# Patient Record
Sex: Female | Born: 1985 | Race: White | Hispanic: No | Marital: Married | Smoking: Current every day smoker
Health system: Southern US, Community
[De-identification: ages and names within clinical notes are randomized; demographics above are authoritative.]

## PROBLEM LIST (undated history)

## (undated) ENCOUNTER — Inpatient Hospital Stay (HOSPITAL_COMMUNITY): Payer: Self-pay

## (undated) DIAGNOSIS — F419 Anxiety disorder, unspecified: Secondary | ICD-10-CM

## (undated) DIAGNOSIS — I10 Essential (primary) hypertension: Secondary | ICD-10-CM

## (undated) DIAGNOSIS — Z6791 Unspecified blood type, Rh negative: Secondary | ICD-10-CM

## (undated) DIAGNOSIS — F329 Major depressive disorder, single episode, unspecified: Secondary | ICD-10-CM

## (undated) DIAGNOSIS — D509 Iron deficiency anemia, unspecified: Secondary | ICD-10-CM

## (undated) DIAGNOSIS — Z8742 Personal history of other diseases of the female genital tract: Secondary | ICD-10-CM

## (undated) DIAGNOSIS — F32A Depression, unspecified: Secondary | ICD-10-CM

---
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
## 2007-01-25 ENCOUNTER — Inpatient Hospital Stay (HOSPITAL_COMMUNITY): Admission: AD | Admit: 2007-01-25 | Discharge: 2007-01-29 | Payer: Self-pay | Admitting: Obstetrics

## 2007-01-26 ENCOUNTER — Encounter (INDEPENDENT_AMBULATORY_CARE_PROVIDER_SITE_OTHER): Payer: Self-pay | Admitting: Obstetrics

## 2007-07-21 ENCOUNTER — Ambulatory Visit: Payer: Self-pay | Admitting: Family Medicine

## 2007-07-21 DIAGNOSIS — R5381 Other malaise: Secondary | ICD-10-CM

## 2007-07-21 DIAGNOSIS — F17200 Nicotine dependence, unspecified, uncomplicated: Secondary | ICD-10-CM

## 2007-07-21 DIAGNOSIS — R5383 Other fatigue: Secondary | ICD-10-CM

## 2007-07-22 LAB — CONVERTED CEMR LAB
Albumin: 4 g/dL (ref 3.5–5.2)
Alkaline Phosphatase: 63 units/L (ref 39–117)
BUN: 8 mg/dL (ref 6–23)
Bilirubin, Direct: 0.1 mg/dL (ref 0.0–0.3)
CO2: 26 meq/L (ref 19–32)
GFR calc Af Amer: 137 mL/min
Monocytes Absolute: 0.5 10*3/uL (ref 0.1–1.0)
Monocytes Relative: 9.8 % (ref 3.0–12.0)
Neutrophils Relative %: 53.6 % (ref 43.0–77.0)
Platelets: 299 10*3/uL (ref 150–400)
TSH: 1.66 microintl units/mL (ref 0.35–5.50)
Total Bilirubin: 0.7 mg/dL (ref 0.3–1.2)
Total Protein: 6.8 g/dL (ref 6.0–8.3)
WBC: 5.3 10*3/uL (ref 4.5–10.5)

## 2007-08-04 ENCOUNTER — Ambulatory Visit: Payer: Self-pay | Admitting: Family Medicine

## 2007-08-04 DIAGNOSIS — D509 Iron deficiency anemia, unspecified: Secondary | ICD-10-CM

## 2007-08-04 DIAGNOSIS — F329 Major depressive disorder, single episode, unspecified: Secondary | ICD-10-CM | POA: Insufficient documentation

## 2007-09-15 ENCOUNTER — Ambulatory Visit: Payer: Self-pay | Admitting: Family Medicine

## 2007-09-16 LAB — CONVERTED CEMR LAB
Basophils Absolute: 0 10*3/uL (ref 0.0–0.1)
Monocytes Relative: 9.9 % (ref 3.0–12.0)
Neutro Abs: 2.7 10*3/uL (ref 1.4–7.7)
Neutrophils Relative %: 57.5 % (ref 43.0–77.0)
Platelets: 204 10*3/uL (ref 150–400)
RBC: 4.4 M/uL (ref 3.87–5.11)
WBC: 4.7 10*3/uL (ref 4.5–10.5)

## 2007-11-19 ENCOUNTER — Ambulatory Visit: Payer: Self-pay | Admitting: Family Medicine

## 2007-12-31 ENCOUNTER — Inpatient Hospital Stay (HOSPITAL_COMMUNITY): Admission: AD | Admit: 2007-12-31 | Discharge: 2007-12-31 | Payer: Self-pay | Admitting: Obstetrics & Gynecology

## 2008-01-06 ENCOUNTER — Observation Stay (HOSPITAL_COMMUNITY): Admission: AD | Admit: 2008-01-06 | Discharge: 2008-01-07 | Payer: Self-pay | Admitting: Obstetrics & Gynecology

## 2008-03-25 ENCOUNTER — Emergency Department (HOSPITAL_COMMUNITY): Admission: EM | Admit: 2008-03-25 | Discharge: 2008-03-25 | Payer: Self-pay | Admitting: Emergency Medicine

## 2009-03-30 ENCOUNTER — Inpatient Hospital Stay (HOSPITAL_COMMUNITY): Admission: AD | Admit: 2009-03-30 | Discharge: 2009-03-30 | Payer: Self-pay | Admitting: Family Medicine

## 2009-09-10 ENCOUNTER — Ambulatory Visit: Payer: Self-pay | Admitting: Family Medicine

## 2009-09-10 LAB — CONVERTED CEMR LAB
AST: 20 units/L (ref 0–37)
Albumin: 3.9 g/dL (ref 3.5–5.2)
Basophils Relative: 0.6 % (ref 0.0–3.0)
Creatinine, Ser: 0.6 mg/dL (ref 0.4–1.2)
Eosinophils Absolute: 0.1 10*3/uL (ref 0.0–0.7)
GFR calc non Af Amer: 127.67 mL/min (ref >60)
Glucose, Bld: 68 mg/dL — ABNORMAL LOW (ref 70–99)
HCT: 30.8 % — ABNORMAL LOW (ref 36.0–46.0)
Hemoglobin: 10 g/dL — ABNORMAL LOW (ref 12.0–15.0)
MCHC: 32.6 g/dL (ref 30.0–36.0)
MCV: 69.6 fL — ABNORMAL LOW (ref 78.0–100.0)
Neutrophils Relative %: 53.6 % (ref 43.0–77.0)
Platelets: 198 10*3/uL (ref 150.0–400.0)
RBC: 4.43 M/uL (ref 3.87–5.11)
RDW: 19.8 % — ABNORMAL HIGH (ref 11.5–14.6)
Total Bilirubin: 0.3 mg/dL (ref 0.3–1.2)
Total Protein: 6.7 g/dL (ref 6.0–8.3)
WBC: 4.7 10*3/uL (ref 4.5–10.5)

## 2009-09-11 ENCOUNTER — Encounter: Payer: Self-pay | Admitting: Family Medicine

## 2009-11-12 ENCOUNTER — Ambulatory Visit: Payer: Self-pay | Admitting: Family Medicine
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
## 2010-03-26 NOTE — Letter (Signed)
Summary: Generic Letter  [place] [facility]  [address] [place], [place] [address]   Phone: [phone]  Fax: [phone]    09/11/2009    [facility] [address] [place], [place]  [address]    Dear Ms. Needham,  I have tried to reach you at all your contact numbers [phone] [phone] with out success. (all numbers have been disconnected). I am trying to reach you in regards to your lab results. Please call Dr Royden Purl office and ask to speak with Rena. Dr Milinda Antis has further instruction and other medication you need to pick up and take.   Please contact us at [phone] as soon as possible.           Sincerely,   Lewanda Rife LPN  Appended Document: Generic Letter Did not have to send this letter. I located a work # for pt and reached her at work.

## 2010-05-15 LAB — CBC
Hemoglobin: 9.6 g/dL — ABNORMAL LOW (ref 12.0–15.0)
MCHC: 33.2 g/dL (ref 30.0–36.0)
Platelets: 222 10*3/uL (ref 150–400)
RDW: 18.1 % — ABNORMAL HIGH (ref 11.5–15.5)
WBC: 5.5 10*3/uL (ref 4.0–10.5)

## 2010-05-15 LAB — URINALYSIS, ROUTINE W REFLEX MICROSCOPIC
Hgb urine dipstick: NEGATIVE
Ketones, ur: NEGATIVE mg/dL
Urobilinogen, UA: 0.2 mg/dL (ref 0.0–1.0)

## 2010-05-15 LAB — ABO/RH: ABO/RH(D): A NEG

## 2010-05-15 LAB — WET PREP, GENITAL
Clue Cells Wet Prep HPF POC: NONE SEEN
Trich, Wet Prep: NONE SEEN
Yeast Wet Prep HPF POC: NONE SEEN

## 2010-05-15 LAB — GC/CHLAMYDIA PROBE AMP, GENITAL
Chlamydia, DNA Probe: NEGATIVE
GC Probe Amp, Genital: NEGATIVE

## 2010-05-15 LAB — HCG, QUANTITATIVE, PREGNANCY: hCG, Beta Chain, Quant, S: 56704 m[IU]/mL — ABNORMAL HIGH (ref <5)

## 2010-05-15 LAB — POCT PREGNANCY, URINE: Preg Test, Ur: POSITIVE

## 2010-06-10 LAB — POCT URINALYSIS DIP (DEVICE)
Bilirubin Urine: NEGATIVE
Glucose, UA: NEGATIVE mg/dL
Hgb urine dipstick: NEGATIVE
Urobilinogen, UA: 0.2 mg/dL (ref 0.0–1.0)

## 2010-06-10 LAB — URINE CULTURE

## 2010-06-10 LAB — POCT PREGNANCY, URINE: Preg Test, Ur: POSITIVE

## 2010-07-09 NOTE — Consult Note (Signed)
Jeanne Lyons, Jeanne Lyons               ACCOUNT NO.:  [id number]   MEDICAL RECORD NO.:  [id number]          PATIENT TYPE:  MAT   LOCATION:  [facility]                          FACILITY:  [facility]   PHYSICIAN:  Lenoard Aden, M.D.DATE OF BIRTH:  [birth year]   DATE OF CONSULTATION:  DATE OF DISCHARGE:                                 CONSULTATION   CHIEF COMPLAINT:  She is a 25 year old white female, G2, P0, history of  previous pregnancy loss with unsure LMP and quant greater than 100,000  for evaluation of spotting.   ALLERGIES:  SHE HAS NO KNOWN DRUG ALLERGIES.   MEDICATIONS:  Prenatal vitamins and Prometrium.   HABITS:  She is a non-smoker, non-drinker.  She denies domestic or  physical violence.   FAMILY HISTORY:  Noncontributory.   SOCIAL HISTORY:  Noncontributory.   PHYSICAL EXAMINATION:  GENERAL:  She is a well-developed, well-nourished  white female, no acute distress.  HEENT:  Lungs are clear.  HEART:  Regular rhythm.  ABDOMEN:  Soft, nontender.  Uterus is 8-10 weeks size.  No adnexal  masses.  Cervix is closed.  No active bleeding noted.  EXTREMITIES:  No crepitus.  NEUROLOGIC:  Nonfocal.   Ultrasound reveals a 10-week intrauterine pregnancy with small  subchorionic hematoma, fetal heart tones noted.  Blood type is A  negative.   IMPRESSION:  1. A 10-week intrauterine pregnancy with first trimester bleeding,      viability noted.  Small subchorionic hemorrhage noted.  2. Rh negative.   PLAN:  Proceed with RhoGAM.  Discharge home.  Precautions given.  Follow  up for obstetric care within 1 to 2 weeks.      Lenoard Aden, M.D.  Electronically Signed     RJT/MEDQ  D:  08/01/2006  T:  08/01/2006  Job:  [id number]   cc:   Lenoard Aden, M.D.  Fax: [phone]

## 2010-07-12 NOTE — Discharge Summary (Signed)
Jeanne Lyons, Jeanne Lyons               ACCOUNT NO.:  [id number]   MEDICAL RECORD NO.:  [id number]          PATIENT TYPE:  INP   LOCATION:  9320                          FACILITY:  [facility]   PHYSICIAN:  Kathreen Cosier, M.D.DATE OF BIRTH:  October 02, 1985   DATE OF ADMISSION:  01/25/2007  DATE OF DISCHARGE:  01/29/2007                               DISCHARGE SUMMARY   The patient is a 25 year old gravida 3, para 0-0-2-0, [facility] February 25, 2007, seen in the office on December 1 with a blood pressure of 140/100,  and in the hospital diastolics up to 104.  Platelets 132,000, 2+  reflexes, uric acid 6+.  GBS unknown.  Cleocin 900 IV q.8 hours was  given.  She was admitted at 36 weeks, magnesium sulfate and induction.  She was started on Pitocin.  On December 2, had a normal vaginal  delivery of a female, Apgar's 6/7.  Team in attendance.   POSTDELIVERY:  On postpartum day #1, highest blood pressure 127/84.  Output good.  She was continued on magnesium.  Her hemoglobin was 7.9  and she was started on ferrous sulfate twice a day.  On December 4,  postpartum day #2, highest blood pressure 157/108 and the lowest was  160/67, and the magnesium was discontinued.  She was started on  labetalol p.o. b.i.d.  She did well.  On December 5, blood pressure  130/85.  She was discharged home on labetalol 200 mg p.o. b.i.d.,  ferrous sulfate twice a day and Tylox for pain.   DISCHARGE DIAGNOSIS:  Status post preeclampsia at 36 weeks, normal  vaginal delivery.           ______________________________  Kathreen Cosier, M.D.     BAM/MEDQ  D:  02/16/2007  T:  02/17/2007  Job:  [id number]

## 2010-08-15 ENCOUNTER — Emergency Department (INDEPENDENT_AMBULATORY_CARE_PROVIDER_SITE_OTHER): Payer: Self-pay

## 2010-08-15 ENCOUNTER — Emergency Department (HOSPITAL_BASED_OUTPATIENT_CLINIC_OR_DEPARTMENT_OTHER)
Admission: EM | Admit: 2010-08-15 | Discharge: 2010-08-15 | Disposition: A | Discharge status: Home / Self Care | Payer: Self-pay | Attending: Emergency Medicine | Admitting: Emergency Medicine

## 2010-08-15 DIAGNOSIS — W19XXXA Unspecified fall, initial encounter: Secondary | ICD-10-CM | POA: Insufficient documentation

## 2010-08-15 DIAGNOSIS — IMO0002 Reserved for concepts with insufficient information to code with codable children: Secondary | ICD-10-CM | POA: Insufficient documentation

## 2010-08-15 DIAGNOSIS — Y92009 Unspecified place in unspecified non-institutional (private) residence as the place of occurrence of the external cause: Secondary | ICD-10-CM | POA: Insufficient documentation

## 2010-08-15 DIAGNOSIS — M25519 Pain in unspecified shoulder: Secondary | ICD-10-CM

## 2010-08-30 ENCOUNTER — Inpatient Hospital Stay (HOSPITAL_COMMUNITY)
Admission: AD | Admit: 2010-08-30 | Discharge: 2010-08-30 | Disposition: A | Discharge status: Home / Self Care | Payer: Self-pay | Source: Ambulatory Visit | Attending: Obstetrics and Gynecology | Admitting: Obstetrics and Gynecology

## 2010-08-30 DIAGNOSIS — R109 Unspecified abdominal pain: Secondary | ICD-10-CM | POA: Insufficient documentation

## 2010-08-30 DIAGNOSIS — N760 Acute vaginitis: Secondary | ICD-10-CM | POA: Insufficient documentation

## 2010-08-30 DIAGNOSIS — N949 Unspecified condition associated with female genital organs and menstrual cycle: Secondary | ICD-10-CM

## 2010-08-30 DIAGNOSIS — N938 Other specified abnormal uterine and vaginal bleeding: Secondary | ICD-10-CM | POA: Insufficient documentation

## 2010-08-30 DIAGNOSIS — A499 Bacterial infection, unspecified: Secondary | ICD-10-CM

## 2010-08-30 DIAGNOSIS — B9689 Other specified bacterial agents as the cause of diseases classified elsewhere: Secondary | ICD-10-CM | POA: Insufficient documentation

## 2010-08-30 LAB — URINALYSIS, ROUTINE W REFLEX MICROSCOPIC
Glucose, UA: NEGATIVE mg/dL
Ketones, ur: NEGATIVE mg/dL
Nitrite: NEGATIVE
Specific Gravity, Urine: 1.01 (ref 1.005–1.030)
pH: 6 (ref 5.0–8.0)

## 2010-08-30 LAB — POCT PREGNANCY, URINE: Preg Test, Ur: NEGATIVE

## 2010-08-30 LAB — WET PREP, GENITAL: Yeast Wet Prep HPF POC: NONE SEEN

## 2010-08-30 LAB — URINE MICROSCOPIC-ADD ON

## 2010-08-31 LAB — GC/CHLAMYDIA PROBE AMP, GENITAL
Chlamydia, DNA Probe: NEGATIVE
GC Probe Amp, Genital: NEGATIVE

## 2010-11-26 LAB — RH IMMUNE GLOBULIN WORKUP (NOT WOMEN'S HOSP)
ABO/RH(D): A NEG
Antibody Screen: NEGATIVE

## 2010-11-26 LAB — CBC
HCT: 24.1 — ABNORMAL LOW
Hemoglobin: 8.2 — ABNORMAL LOW
MCHC: 34
MCHC: 34.1
MCV: 84.7
Platelets: 146 — ABNORMAL LOW
Platelets: 159
Platelets: 171
RDW: 15.2
RDW: 15.4
RDW: 15.6 — ABNORMAL HIGH
WBC: 6.4

## 2010-11-26 LAB — DIFFERENTIAL
Basophils Absolute: 0
Lymphocytes Relative: 30
Lymphs Abs: 1.9
Neutrophils Relative %: 62

## 2010-11-26 LAB — URINE MICROSCOPIC-ADD ON

## 2010-11-26 LAB — URINALYSIS, ROUTINE W REFLEX MICROSCOPIC
Glucose, UA: NEGATIVE
Glucose, UA: NEGATIVE
Ketones, ur: 15 — AB
Leukocytes, UA: NEGATIVE
Nitrite: NEGATIVE
Protein, ur: NEGATIVE
Specific Gravity, Urine: <1.005 — ABNORMAL LOW
Urobilinogen, UA: 0.2
pH: 5.5

## 2010-11-26 LAB — POCT PREGNANCY, URINE: Preg Test, Ur: POSITIVE

## 2010-11-26 LAB — TYPE AND SCREEN
ABO/RH(D): A NEG
Antibody Screen: NEGATIVE

## 2010-12-02 LAB — URINALYSIS, ROUTINE W REFLEX MICROSCOPIC
Bilirubin Urine: NEGATIVE
Glucose, UA: NEGATIVE
Nitrite: NEGATIVE
Specific Gravity, Urine: <1.005 — ABNORMAL LOW
pH: 6

## 2010-12-02 LAB — COMPREHENSIVE METABOLIC PANEL
ALT: 12
ALT: 13
AST: 25
Albumin: 1.6 — ABNORMAL LOW
Albumin: 2.2 — ABNORMAL LOW
Albumin: 2.6 — ABNORMAL LOW
Alkaline Phosphatase: 122 — ABNORMAL HIGH
Alkaline Phosphatase: 85
BUN: 1 — ABNORMAL LOW
CO2: 22
CO2: 23
Chloride: 104
Chloride: 107
Creatinine, Ser: 0.66
GFR calc Af Amer: >60
Glucose, Bld: 88
Potassium: 3.2 — ABNORMAL LOW
Potassium: 3.3 — ABNORMAL LOW
Potassium: 3.3 — ABNORMAL LOW
Sodium: 137
Sodium: 138
Total Bilirubin: 0.6
Total Bilirubin: 0.7
Total Protein: 5.2 — ABNORMAL LOW

## 2010-12-02 LAB — CBC
HCT: 23.7 — ABNORMAL LOW
Hemoglobin: 10.2 — ABNORMAL LOW
MCV: 84.3
MCV: 85.7
Platelets: 138 — ABNORMAL LOW
Platelets: 139 — ABNORMAL LOW
Platelets: 141 — ABNORMAL LOW
RBC: 2.76 — ABNORMAL LOW
RBC: 3.7 — ABNORMAL LOW
RDW: 14.8
WBC: 10.4
WBC: 8
WBC: 9.5

## 2010-12-02 LAB — URINE MICROSCOPIC-ADD ON

## 2010-12-02 LAB — RPR: RPR Ser Ql: NONREACTIVE

## 2010-12-02 LAB — URIC ACID: Uric Acid, Serum: 6.4

## 2010-12-02 LAB — RH IMMUNE GLOB WKUP(>/=20WKS)(NOT WOMEN'S HOSP)

## 2010-12-02 LAB — MAGNESIUM: Magnesium: 6.1

## 2010-12-04 LAB — RH IMMUNE GLOBULIN WORKUP (NOT WOMEN'S HOSP)

## 2010-12-05 LAB — URINALYSIS, ROUTINE W REFLEX MICROSCOPIC
Glucose, UA: NEGATIVE
Hgb urine dipstick: NEGATIVE
Ketones, ur: NEGATIVE
Protein, ur: NEGATIVE
Urobilinogen, UA: 0.2

## 2010-12-06 LAB — POCT URINALYSIS DIP (DEVICE)
Ketones, ur: NEGATIVE
Protein, ur: NEGATIVE
Specific Gravity, Urine: 1.01
Urobilinogen, UA: 0.2
pH: 6.5

## 2010-12-06 LAB — GC/CHLAMYDIA PROBE AMP, GENITAL
Chlamydia, DNA Probe: NEGATIVE
GC Probe Amp, Genital: NEGATIVE

## 2010-12-06 LAB — URINE CULTURE: Colony Count: 2000

## 2010-12-12 LAB — URINALYSIS, ROUTINE W REFLEX MICROSCOPIC
Bilirubin Urine: NEGATIVE
Glucose, UA: NEGATIVE
Hgb urine dipstick: NEGATIVE
Ketones, ur: NEGATIVE
Nitrite: NEGATIVE
Protein, ur: NEGATIVE
Specific Gravity, Urine: <1.005 — ABNORMAL LOW
Urobilinogen, UA: 0.2
pH: 7

## 2010-12-12 LAB — RH IMMUNE GLOBULIN WORKUP (NOT WOMEN'S HOSP): ABO/RH(D): A NEG

## 2010-12-12 LAB — ABO/RH
ABO/RH(D): A NEG
DAT, IgG: NEGATIVE

## 2010-12-12 LAB — CBC
MCV: 89
Platelets: 221
WBC: 5.9

## 2010-12-12 LAB — HCG, QUANTITATIVE, PREGNANCY: hCG, Beta Chain, Quant, S: 117529 — ABNORMAL HIGH

## 2010-12-12 LAB — POCT PREGNANCY, URINE
Operator id: 246641
Preg Test, Ur: POSITIVE

## 2011-08-07 ENCOUNTER — Encounter (HOSPITAL_COMMUNITY): Payer: Self-pay | Admitting: *Deleted

## 2011-08-07 ENCOUNTER — Inpatient Hospital Stay (HOSPITAL_COMMUNITY)
Admission: AD | Admit: 2011-08-07 | Discharge: 2011-08-07 | Disposition: A | Discharge status: Home / Self Care | Payer: Medicaid Other | Source: Ambulatory Visit | Attending: Obstetrics & Gynecology | Admitting: Obstetrics & Gynecology

## 2011-08-07 DIAGNOSIS — R109 Unspecified abdominal pain: Secondary | ICD-10-CM | POA: Insufficient documentation

## 2011-08-07 DIAGNOSIS — Z3201 Encounter for pregnancy test, result positive: Secondary | ICD-10-CM | POA: Insufficient documentation

## 2011-08-07 LAB — URINALYSIS, ROUTINE W REFLEX MICROSCOPIC
Hgb urine dipstick: NEGATIVE
Nitrite: NEGATIVE
Specific Gravity, Urine: 1.01 (ref 1.005–1.030)
Urobilinogen, UA: 0.2 mg/dL (ref 0.0–1.0)
pH: 5.5 (ref 5.0–8.0)

## 2011-08-07 NOTE — MAU Provider Note (Signed)
Jeanne Kluck Plumley26 y.o.[id number] @[redacted]w[redacted]d  by LMP Chief Complaint  Patient presents with  . Abdominal Cramping     None     SUBJECTIVE  HPI: Pt presents to MAU reporting she had mild abdominal cramping before arriving in MAU but denies pain now.  She had a negative HPT last week but her test at home today was positive.   Patient's last menstrual period was 06/24/2011.  She reports irregular periods.  She denies LOF, vaginal bleeding, vaginal itching/burning, urinary symptoms, h/a, dizziness, n/v, or fever/chills.    Past Medical History  Diagnosis Date  . No pertinent past medical history    Past Surgical History  Procedure Date  . No past surgeries    History   Social History  . Marital Status: Single    Spouse Name: N/A    Number of Children: N/A  . Years of Education: N/A   Occupational History  . Not on file.   Social History Main Topics  . Smoking status: Current Some Day Smoker -- 1.0 packs/day  . Smokeless tobacco: Not on file  . Alcohol Use: No  . Drug Use: No  . Sexually Active: Yes   Other Topics Concern  . Not on file   Social History Narrative  . No narrative on file   No current facility-administered medications on file prior to encounter.   No current outpatient prescriptions on file prior to encounter.   Allergies  Allergen Reactions  . Amoxicillin     REACTION: rash  . Drospirenone-Ethinyl Estradiol     REACTION: irritability    ROS: Pertinent items in HPI  OBJECTIVE Blood pressure 118/79, pulse 81, temperature 98.1 F (36.7 C), temperature source Oral, resp. rate 18, height 5\' 5"  (1.651 m), weight 60.328 kg (133 lb).  GENERAL: Well-developed, well-nourished female in no acute distress.  HEENT: Normocephalic, good dentition HEART: normal rate RESP: normal effort ABDOMEN: Soft, nontender EXTREMITIES: Nontender, no edema NEURO: Alert and oriented SPECULUM EXAM: Deferred  LAB RESULTS Results for orders placed during the hospital encounter  of 08/07/11 (from the past 24 hour(s))  URINALYSIS, ROUTINE W REFLEX MICROSCOPIC     Status: Abnormal   Collection Time   08/07/11  7:55 PM      Component Value Range   Color, Urine STRAW (*) YELLOW   APPearance CLEAR  CLEAR   Specific Gravity, Urine 1.010  1.005 - 1.030   pH 5.5  5.0 - 8.0   Glucose, UA NEGATIVE  NEGATIVE mg/dL   Hgb urine dipstick NEGATIVE  NEGATIVE   Bilirubin Urine NEGATIVE  NEGATIVE   Ketones, ur NEGATIVE  NEGATIVE mg/dL   Protein, ur NEGATIVE  NEGATIVE mg/dL   Urobilinogen, UA 0.2  0.0 - 1.0 mg/dL   Nitrite NEGATIVE  NEGATIVE   Leukocytes, UA NEGATIVE  NEGATIVE  POCT PREGNANCY, URINE     Status: Abnormal   Collection Time   08/07/11  8:08 PM      Component Value Range   Preg Test, Ur POSITIVE (*) NEGATIVE    ASSESSMENT Positive pregnancy test  PLAN Pt denies pain or bleeding at this time so pelvic exam/U/S deferred D/C home with ectopic pregnancy teaching/precautions Pregnancy verification letter given F/U with early prenatal care Return to MAU as needed  LEFTWICH-KIRBY, Clemmie Buelna 08/07/2011 10:21 PM

## 2011-08-07 NOTE — Discharge Instructions (Signed)
Take 400 mcg of Folic acid every day and at least 27 mg of iron.   Prenatal Care  WHAT IS PRENATAL CARE?  Prenatal care means health care during your pregnancy, before your baby is born. Take care of yourself and your baby by:   Getting early prenatal care. If you know you are pregnant, or think you might be pregnant, call your caregiver as soon as possible. Schedule a visit for a general/prenatal examination.   Getting regular prenatal care. Follow your caregiver's schedule for blood and other necessary tests. Do not miss appointments.   Do everything you can to keep yourself and your baby healthy during your pregnancy.   Prenatal care should include evaluation of medical, dietary, educational, psychological, and social needs for the couple and the medical, surgical, and genetic history of the family of the mother and father.   Discuss with your caregiver:   Your medicines, prescription, over-the-counter, and herbal medicines.   Substance abuse, alcohol, smoking, and illegal drugs.   Domestic abuse and violence, if present.   Your immunizations.   Nutrition and diet.   Exercising.   Environment and occupational hazards, at home and at work.   History of sexually transmitted disease, both you and your partner.   Previous pregnancies.  WHY IS PRENATAL CARE SO IMPORTANT?  By seeing you regularly, your caregiver has the chance to find problems early, so that they can be treated as soon as possible. Other problems might be prevented. Many studies have shown that early and regular prenatal care is important for the health of both mothers and their babies.  I AM THINKING ABOUT GETTING PREGNANT. HOW CAN I TAKE CARE OF MYSELF?  Taking care of yourself before you get pregnant helps you to have a healthy pregnancy. It also lowers your chances of having a baby born with a birth defect. Here are ways to take care of yourself before you get pregnant:   Eat healthy foods, exercise regularly  (30 minutes per day for most days of the week is best), and get enough rest and sleep. Talk to your caregiver about what kinds of foods and exercises are best for you.   Take 400 micrograms (mcg) of folic acid (one of the B vitamins) every day. The best way to do this is to take a daily multivitamin pill that contains this amount of folic acid. Getting enough of the synthetic (manufactured) form of folic acid every day before you get pregnant and during early pregnancy can help prevent certain birth defects. Many breakfast cereals and other grain products have folic acid added to them, but only certain cereals contain 400 mcg of folic acid per serving. Check the label on your multivitamin or cereal to find the amount of folic acid in the food.   See your caregiver for a complete check up before getting pregnant. Make sure that you have had all your immunization shots, especially for rubella ([place] measles). Rubella can cause serious birth defects. Chickenpox is another illness you want to avoid during pregnancy. If you have had chickenpox and rubella in the past, you should be immune to them.   Tell your caregiver about any prescription or non-prescription medicines (including herbal remedies) you are taking. Some medicines are not safe to take during pregnancy.   Stop smoking cigarettes, drinking alcohol, or taking illegal drugs. Ask your caregiver for help, if you need it. You can also get help with alcohol and drugs by talking with a member of your faith  community, a Veterinary surgeon, or a trusted friend.   Discuss and treat any medical, social, or psychological problems before getting pregnant.   Discuss any history of genetic problems in the mother, father, and their families. Do genetic testing before getting pregnant, when possible.   Discuss any physical or emotional abuse with your caregiver.   Discuss with your caregiver if you might be exposed to harmful chemicals on your job or where you live.     Discuss with your caregiver if you think your job or the hours you work may be harmful and should be changed.   The father should be involved with the decision making and with all aspects of the pregnancy, labor, and delivery.   If you have medical insurance, make sure you are covered for pregnancy.  I JUST FOUND OUT THAT I AM PREGNANT. HOW CAN I TAKE CARE OF MYSELF?  Here are ways to take care of yourself and the precious new life growing inside you:   Continue taking your multivitamin with 400 micrograms (mcg) of folic acid every day.   Get early and regular prenatal care. It does not matter if this is your first pregnancy or if you already have children. It is very important to see a caregiver during your pregnancy. Your caregiver will check at each visit to make sure that you and the baby are healthy. If there are any problems, action can be taken right away to help you and the baby.   Eat a healthy diet that includes:   Fruits.   Vegetables.   Foods low in saturated fat.   Grains.   Calcium-rich foods.   Drink 6 to 8 glasses of liquids a day.   Unless your caregiver tells you not to, try to be physically active for 30 minutes, most days of the week. If you are pressed for time, you can get your activity in through 10 minute segments, three times a day.   If you smoke, drink alcohol, or use drugs, STOP. These can cause long-term damage to your baby. Talk with your caregiver about steps to take to stop smoking. Talk with a member of your faith community, a counselor, a trusted friend, or your caregiver if you are concerned about your alcohol or drug use.   Ask your caregiver before taking any medicine, even over-the-counter medicines. Some medicines are not safe to take during pregnancy.   Get plenty of rest and sleep.   Avoid hot tubs and saunas during pregnancy.   Do not have X-rays taken, unless absolutely necessary and with the recommendation of your caregiver. A lead  shield can be placed on your abdomen, to protect the baby when X-rays are taken in other parts of the body.   Do not empty the cat litter when you are pregnant. It may contain a parasite that causes an infection called toxoplasmosis, which can cause birth defects. Also, use gloves when working in garden areas used by cats.   Do not eat uncooked or undercooked cheese, meats, or fish.   Stay away from toxic chemicals like:   Insecticides.   Solvents (some cleaners or paint thinners).   Lead.   Mercury.   Sexual relations may continue until the end of the pregnancy, unless you have a medical problem or there is a problem with the pregnancy and your caregiver tells you not to.   Do not wear high heel shoes, especially during the second half of the pregnancy. You can lose your balance and  fall.   Do not take long trips, unless absolutely necessary. Be sure to see your caregiver before going on the trip.   Do not sit in one position for more than 2 hours, when on a trip.   Take a copy of your medical records when going on a trip.   Know where there is a hospital in the city you are visiting, in case of an emergency.   Most dangerous household products will have pregnancy warnings on their labels. Ask your caregiver about products if you are unsure.   Limit or eliminate your caffeine intake from coffee, tea, sodas, medicines, and chocolate.   Many women continue working through pregnancy. Staying active might help you stay healthier. If you have a question about the safety or the hours you work at your particular job, talk with your caregiver.   Get informed:   Read books.   Watch videos.   Go to childbirth classes for you and the father.   Talk with experienced moms.   Ask your caregiver about childbirth education classes for you and your partner. Classes can help you and your partner prepare for the birth of your baby.   Ask about a pediatrician (baby doctor) and methods and  pain medicine for labor, delivery, and possible Cesarean delivery (C-section).  I AM NOT THINKING ABOUT GETTING PREGNANT RIGHT NOW, BUT HEARD THAT ALL WOMEN SHOULD TAKE FOLIC ACID EVERY DAY?  All women of childbearing age, with even a remote chance of getting pregnant, should try to make sure they get enough folic acid. Many pregnancies are not planned. Many women do not know they are actually pregnant early in their pregnancies, and certain birth defects happen in the very early part of pregnancy. Taking 400 micrograms (mcg) of folic acid every day will help prevent certain birth defects that happen in the early part of pregnancy. If a woman begins taking vitamin pills in the second or third month of pregnancy, it may be too late to prevent birth defects. Folic acid may also have other health benefits for women, besides preventing birth defects.  HOW OFTEN SHOULD I SEE MY CAREGIVER DURING PREGNANCY?  Your caregiver will give you a schedule for your prenatal visits. You will have visits more often as you get closer to the end of your pregnancy. An average pregnancy lasts about 40 weeks.  A typical schedule includes visiting your caregiver:   About once each month, during your first 6 months of pregnancy.   Every 2 weeks, during the next 2 months.   Weekly in the last month, until the delivery date.  Your caregiver will probably want to see you more often if:  You are over 35.   Your pregnancy is high risk, because you have certain health problems or problems with the pregnancy, such as:   Diabetes.   High blood pressure.   The baby is not growing on schedule, according to the dates of the pregnancy.  Your caregiver will do special tests, to make sure you and the baby are not having any serious problems. WHAT HAPPENS DURING PRENATAL VISITS?   At your first prenatal visit, your caregiver will talk to you about you and your partner's health history and your family's health history, and will do  a physical exam.   On your first visit, a physical exam will include checks of your blood pressure, height and weight, and an exam of your pelvic organs. Your caregiver will do a Pap test if you  have not had one recently, and will do cultures of your cervix to make sure there is no infection.   At each visit, there will be tests of your blood, urine, blood pressure, weight, and checking the progress of the baby.   Your caregiver will be able to tell you when to expect that your baby will be born.   Each visit is also a chance for you to learn about staying healthy during pregnancy and for asking questions.   Discuss whether you will be breastfeeding.   At your later prenatal visits, your caregiver will check how you are doing and how the baby is developing. You may have a number of tests done as your pregnancy progresses.   Ultrasound exams are often used to check on the baby's growth and health.   You may have more urine and blood tests, as well as special tests, if needed. These may include amniocentesis (examine fluid in the pregnancy sac), stress tests (check how baby responds to contractions), biophysical profile (measures fetus well-being). Your caregiver will explain the tests and why they are necessary.  Document Released: 02/13/2003 Document Revised: 01/30/2011 Document Reviewed: 01/10/2009 [facility] Patient Information 2012 [place], [place].

## 2011-08-07 NOTE — MAU Note (Signed)
PT SAYS  HAD IUD IN - WAS INSERTED  BY [facility] IN 2010-  BUT SAYS IN April 29TH - WAS IN TUB- IUD FELL OUT.  NO OTHER BC. LAST SEX-  07-26-2011.   FEELS CRAMPS - TODAY-- NO BLEEDING.  DID HOME PREG TEST  ON  6-6- NEG.  DID ANOTHER   PREG TEST TODAY - POSTIVE.

## 2011-08-07 NOTE — MAU Note (Signed)
Pt reports + pregnancy test today, pt took a home pregnancy test last week which was negative. Pt states that she has had cramping since to. Deneis bleeding.

## 2011-08-14 ENCOUNTER — Inpatient Hospital Stay (HOSPITAL_COMMUNITY): Payer: Medicaid Other

## 2011-08-14 ENCOUNTER — Inpatient Hospital Stay (HOSPITAL_COMMUNITY)
Admission: AD | Admit: 2011-08-14 | Discharge: 2011-08-14 | Disposition: A | Discharge status: Home / Self Care | Payer: Medicaid Other | Source: Ambulatory Visit | Attending: Obstetrics & Gynecology | Admitting: Obstetrics & Gynecology

## 2011-08-14 ENCOUNTER — Encounter (HOSPITAL_COMMUNITY): Payer: Self-pay | Admitting: *Deleted

## 2011-08-14 DIAGNOSIS — R109 Unspecified abdominal pain: Secondary | ICD-10-CM | POA: Insufficient documentation

## 2011-08-14 DIAGNOSIS — O209 Hemorrhage in early pregnancy, unspecified: Secondary | ICD-10-CM | POA: Insufficient documentation

## 2011-08-14 LAB — CBC
HCT: 40.4 % (ref 36.0–46.0)
Hemoglobin: 13.7 g/dL (ref 12.0–15.0)
MCH: 29.5 pg (ref 26.0–34.0)
MCHC: 33.9 g/dL (ref 30.0–36.0)
MCV: 87.1 fL (ref 78.0–100.0)
WBC: 6.2 10*3/uL (ref 4.0–10.5)

## 2011-08-14 LAB — URINALYSIS, ROUTINE W REFLEX MICROSCOPIC
Glucose, UA: NEGATIVE mg/dL
Leukocytes, UA: NEGATIVE
Protein, ur: NEGATIVE mg/dL
Urobilinogen, UA: 0.2 mg/dL (ref 0.0–1.0)

## 2011-08-14 NOTE — MAU Note (Signed)
Was here before for cramps, had stopped when got here. Cramping started again this morning, feels like someone is scraping down with a knife.  When wiped this morning saw pink, was told she is RH neg and if saw any bleeding to come in .

## 2011-08-14 NOTE — MAU Provider Note (Signed)
Terryn Rosenkranz Plumley26 y.o.[id number] @[redacted]w[redacted]d  by LMP Chief Complaint  Patient presents with  . Abdominal Pain     Provider contact at 1750    SUBJECTIVE  HPI: Had 1 streak of pink blood after wiping earlier today. Not postcoital. No abd pain. Seen here 2 wks ago and cultures and WP were negative.   Past Medical History  Diagnosis Date  . No pertinent past medical history    Past Surgical History  Procedure Date  . No past surgeries    History   Social History  . Marital Status: Single    Spouse Name: N/A    Number of Children: N/A  . Years of Education: N/A   Occupational History  . Not on file.   Social History Main Topics  . Smoking status: Current Some Day Smoker -- 1.0 packs/day  . Smokeless tobacco: Not on file  . Alcohol Use: No  . Drug Use: No  . Sexually Active: Yes   Other Topics Concern  . Not on file   Social History Narrative  . No narrative on file   No current facility-administered medications on file prior to encounter.   Current Outpatient Prescriptions on File Prior to Encounter  Medication Sig Dispense Refill  . ferrous fumarate (FERRO-SEQUELS) 50 MG CR tablet Take 50 mg by mouth daily.      . Multiple Vitamins-Minerals (MULTIVITAMIN WITH MINERALS) tablet Take 1 tablet by mouth daily.      . vitamin C (ASCORBIC ACID) 500 MG tablet Take 500 mg by mouth daily.       Allergies  Allergen Reactions  . Amoxicillin     REACTION: rash  . Drospirenone-Ethinyl Estradiol     REACTION: irritability    ROS: Pertinent items in HPI  OBJECTIVE Blood pressure 121/84, pulse 76, temperature 99.2 F (37.3 C), temperature source Oral, resp. rate 18, height 5\' 5"  (1.651 m), weight 59.421 kg (131 lb), last menstrual period 06/24/2011, SpO2 100.00%.  GENERAL: Well-developed, well-nourished female in no acute distress.  HEENT: Normocephalic, good dentition HEART: normal rate RESP: normal effort ABDOMEN: Soft, nontender EXTREMITIES: Nontender, no edema NEURO:  Alert and oriented SPECULUM EXAM: NEFG, physiologic discharge, no blood noted, cervix clean BIMANUAL: cervix closed ; uterus retroverted; no adnexal tenderness or masses   LAB RESULTS  @RES24 @ Results for orders placed during the hospital encounter of 08/14/11 (from the past 24 hour(s))  URINALYSIS, ROUTINE W REFLEX MICROSCOPIC     Status: Abnormal   Collection Time   08/14/11  2:05 PM      Component Value Range   Color, Urine STRAW (*) YELLOW   APPearance CLEAR  CLEAR   Specific Gravity, Urine <1.005 (*) 1.005 - 1.030   pH 6.5  5.0 - 8.0   Glucose, UA NEGATIVE  NEGATIVE mg/dL   Hgb urine dipstick NEGATIVE  NEGATIVE   Bilirubin Urine NEGATIVE  NEGATIVE   Ketones, ur NEGATIVE  NEGATIVE mg/dL   Protein, ur NEGATIVE  NEGATIVE mg/dL   Urobilinogen, UA 0.2  0.0 - 1.0 mg/dL   Nitrite NEGATIVE  NEGATIVE   Leukocytes, UA NEGATIVE  NEGATIVE  CBC     Status: Normal   Collection Time   08/14/11  4:20 PM      Component Value Range   WBC 6.2  4.0 - 10.5 K/uL   RBC 4.64  3.87 - 5.11 MIL/uL   Hemoglobin 13.7  12.0 - 15.0 g/dL   HCT 45.4  09.8 - 11.9 %   MCV 87.1  78.0 - 100.0 fL   MCH 29.5  26.0 - 34.0 pg   MCHC 33.9  30.0 - 36.0 g/dL   RDW 16.1  09.6 - 04.5 %   Platelets 253  150 - 400 K/uL  HCG, QUANTITATIVE, PREGNANCY     Status: Abnormal   Collection Time   08/14/11  4:20 PM      Component Value Range   hCG, Beta Chain, Quant, S 4794 (*) <5 mIU/mL    IMAGING IUGS c/w [redacted]w[redacted]d, YS seen, no embryo  ASSESSMENT  1. Bleeding in early pregnancy   [id number] IUP [redacted]w[redacted]d Probable implantation spotting PLAN  Cont. PNVs and pregnancy precautions. Has list of [facility] providers and will make appt soon.     Leverne Tessler 08/14/2011 6:12 PM

## 2011-10-06 ENCOUNTER — Inpatient Hospital Stay (HOSPITAL_COMMUNITY): Payer: Medicaid Other

## 2011-10-06 ENCOUNTER — Inpatient Hospital Stay (HOSPITAL_COMMUNITY)
Admission: AD | Admit: 2011-10-06 | Discharge: 2011-10-06 | Disposition: A | Discharge status: Home / Self Care | Payer: Medicaid Other | Source: Ambulatory Visit | Attending: Obstetrics and Gynecology | Admitting: Obstetrics and Gynecology

## 2011-10-06 ENCOUNTER — Encounter (HOSPITAL_COMMUNITY): Payer: Self-pay | Admitting: *Deleted

## 2011-10-06 DIAGNOSIS — O26899 Other specified pregnancy related conditions, unspecified trimester: Secondary | ICD-10-CM

## 2011-10-06 DIAGNOSIS — O2650 Maternal hypotension syndrome, unspecified trimester: Secondary | ICD-10-CM | POA: Insufficient documentation

## 2011-10-06 DIAGNOSIS — O0930 Supervision of pregnancy with insufficient antenatal care, unspecified trimester: Secondary | ICD-10-CM | POA: Insufficient documentation

## 2011-10-06 DIAGNOSIS — R109 Unspecified abdominal pain: Secondary | ICD-10-CM | POA: Insufficient documentation

## 2011-10-06 DIAGNOSIS — R55 Syncope and collapse: Secondary | ICD-10-CM

## 2011-10-06 LAB — COMPREHENSIVE METABOLIC PANEL
Alkaline Phosphatase: 49 U/L (ref 39–117)
BUN: 5 mg/dL — ABNORMAL LOW (ref 6–23)
Chloride: 102 mEq/L (ref 96–112)
Creatinine, Ser: 0.49 mg/dL — ABNORMAL LOW (ref 0.50–1.10)
GFR calc Af Amer: >90 mL/min (ref >90)
GFR calc non Af Amer: >90 mL/min (ref >90)
Glucose, Bld: 94 mg/dL (ref 70–99)
Potassium: 3.7 mEq/L (ref 3.5–5.1)
Total Bilirubin: 0.3 mg/dL (ref 0.3–1.2)

## 2011-10-06 LAB — URINALYSIS, ROUTINE W REFLEX MICROSCOPIC
Glucose, UA: NEGATIVE mg/dL
Hgb urine dipstick: NEGATIVE
Ketones, ur: NEGATIVE mg/dL
Leukocytes, UA: NEGATIVE
Protein, ur: NEGATIVE mg/dL
pH: 6 (ref 5.0–8.0)

## 2011-10-06 LAB — CBC
HCT: 37.2 % (ref 36.0–46.0)
Hemoglobin: 12.8 g/dL (ref 12.0–15.0)
MCV: 84.7 fL (ref 78.0–100.0)
WBC: 7.9 10*3/uL (ref 4.0–10.5)

## 2011-10-06 NOTE — MAU Note (Signed)
Was working today and passed out around 1100.  Didn't feel good this morning, was weak and tired. Has been cramping in lower abd, started yesterday. Hx of ovarian cysts- the pain feels a lot like that.  Hasn't eaten today yet.  Feels dizzy when stands.

## 2011-10-06 NOTE — MAU Note (Signed)
Witnessed syncopal episode.  Pt was able to be caught before hitting the ground.

## 2011-10-06 NOTE — MAU Provider Note (Signed)
History   Jeanne Lyons is a 26 y.o. [id number] female at [redacted]w[redacted]d by 1st trimester u/s who presents with brief witnessed syncopal episode while at work today at 1100, associated w/ weakness, dizziness, and diaphoresis, denies sob or chest pain. Hadn't eaten since last night.   Also reports lower abdominal cramping since last Tuesday when she slipped and fell on buttocks, worse after triage nurse got fhr w/ doppler, also nausea/vomiting/diarrhea, pregnancy nausea had resolved a few weeks ago, then came back.  Denies VB or LOF.  Has not begun [facility], waiting on Medicaid card.  Had u/s here on 6/20 at 5.2wks by [facility], with intrauterine gestational sac and yolk visualized.  Last sexual activity 2 months ago.  No h/o STIs.  Last pap smear about 2 years ago- wnl.   CSN: [id number]  Arrival date and time: 10/06/11 1317   First Provider Initiated Contact with Patient 10/06/11 1439      Chief Complaint  Patient presents with  . Loss of Consciousness  . Abdominal Pain   HPI  OB History    Grav Para Term Preterm Abortions TAB SAB Ect Mult Living   5 1  1 3  3   1       Past Medical History  Diagnosis Date  . No pertinent past medical history   . Anemia   . Syncopal episodes     Past Surgical History  Procedure Date  . No past surgeries     Family History  Problem Relation Age of Onset  . Other Neg Hx   . Asthma Father   . Diabetes Father   . Hypertension Father     History  Substance Use Topics  . Smoking status: Current Some Day Smoker -- 0.2 packs/day  . Smokeless tobacco: Never Used  . Alcohol Use: No    Allergies:  Allergies  Allergen Reactions  . Drospirenone-Ethinyl Estradiol     REACTION: irritability  . Amoxicillin Rash    Prescriptions prior to admission  Medication Sig Dispense Refill  . ferrous fumarate (FERRO-SEQUELS) 50 MG CR tablet Take 50 mg by mouth daily.      . Multiple Vitamins-Minerals (MULTIVITAMIN WITH MINERALS) tablet Take 1 tablet by mouth daily.        . vitamin C (ASCORBIC ACID) 500 MG tablet Take 500 mg by mouth daily.        Review of Systems  Constitutional: Positive for diaphoresis (earlier before passing out).  Eyes: Negative.   Respiratory: Negative.   Cardiovascular: Positive for leg swelling. Palpitations: feels heart pounding.  Gastrointestinal: Positive for heartburn, nausea, vomiting, abdominal pain (suprapubic) and diarrhea (x2days).  Genitourinary: Negative.   Musculoskeletal: Positive for back pain (lower back) and falls (slipped and fell on bottom last tuesday).  Skin: Negative.   Neurological: Positive for dizziness, loss of consciousness (brief syncopal episode at 1100 while at work- caught by coworker who lowered her to floor), weakness and headaches.  Endo/Heme/Allergies: Bruises/bleeds easily.   Physical Exam   Blood pressure 114/83, pulse 109, temperature 98 F (36.7 C), temperature source Oral, resp. rate 18, height 5\' 5"  (1.651 m), weight 59.421 kg (131 lb), last menstrual period 06/24/2011.  Physical Exam  Constitutional: She is oriented to person, place, and time. She appears well-developed and well-nourished.  HENT:  Head: Normocephalic.  Eyes: Pupils are equal, round, and reactive to light.  Neck: Normal range of motion.  Cardiovascular: Normal rate, regular rhythm and normal heart sounds.   Respiratory: Effort normal and breath  sounds normal.  GI: Soft. Bowel sounds are normal. She exhibits no distension. There is tenderness (suprapubic and bilateral adnexal).  Genitourinary: Vagina normal.       Spec exam performed, gc/ct and wet prep obtained. Small amount white frothy slightly malodorous d/c present.   Musculoskeletal: Normal range of motion.  Neurological: She is alert and oriented to person, place, and time. She has normal reflexes.  Skin: Skin is warm and dry.  Psychiatric: She has a normal mood and affect. Her behavior is normal. Judgment and thought content normal.   FHR: 150 via  doppler  MAU Course  Procedures Comp OB U/S    CBC, CMP GC/CT, Wet prep Results for orders placed during the hospital encounter of 10/06/11 (from the past 24 hour(s))  URINALYSIS, ROUTINE W REFLEX MICROSCOPIC     Status: Abnormal   Collection Time   10/06/11  2:04 PM      Component Value Range   Color, Urine STRAW (*) YELLOW   APPearance CLEAR  CLEAR   Specific Gravity, Urine <1.005 (*) 1.005 - 1.030   pH 6.0  5.0 - 8.0   Glucose, UA NEGATIVE  NEGATIVE mg/dL   Hgb urine dipstick NEGATIVE  NEGATIVE   Bilirubin Urine NEGATIVE  NEGATIVE   Ketones, ur NEGATIVE  NEGATIVE mg/dL   Protein, ur NEGATIVE  NEGATIVE mg/dL   Urobilinogen, UA 0.2  0.0 - 1.0 mg/dL   Nitrite NEGATIVE  NEGATIVE   Leukocytes, UA NEGATIVE  NEGATIVE  CBC     Status: Normal   Collection Time   10/06/11  3:52 PM      Component Value Range   WBC 7.9  4.0 - 10.5 K/uL   RBC 4.39  3.87 - 5.11 MIL/uL   Hemoglobin 12.8  12.0 - 15.0 g/dL   HCT 40.9  81.1 - 91.4 %   MCV 84.7  78.0 - 100.0 fL   MCH 29.2  26.0 - 34.0 pg   MCHC 34.4  30.0 - 36.0 g/dL   RDW 78.2  95.6 - 21.3 %   Platelets 200  150 - 400 K/uL  COMPREHENSIVE METABOLIC PANEL     Status: Abnormal   Collection Time   10/06/11  3:52 PM      Component Value Range   Sodium 135  135 - 145 mEq/L   Potassium 3.7  3.5 - 5.1 mEq/L   Chloride 102  96 - 112 mEq/L   CO2 24  19 - 32 mEq/L   Glucose, Bld 94  70 - 99 mg/dL   BUN 5 (*) 6 - 23 mg/dL   Creatinine, Ser 0.86 (*) 0.50 - 1.10 mg/dL   Calcium 9.0  8.4 - 57.8 mg/dL   Total Protein 6.3  6.0 - 8.3 g/dL   Albumin 3.5  3.5 - 5.2 g/dL   AST 11  0 - 37 U/L   ALT 7  0 - 35 U/L   Alkaline Phosphatase 49  39 - 117 U/L   Total Bilirubin 0.3  0.3 - 1.2 mg/dL   GFR calc non Af Amer >90  >90 mL/min   GFR calc Af Amer >90  >90 mL/min  WET PREP, GENITAL     Status: Abnormal   Collection Time   10/06/11  4:20 PM      Component Value Range   Yeast Wet Prep HPF POC NONE SEEN  NONE SEEN   Trich, Wet Prep NONE SEEN   NONE SEEN   Clue Cells Wet Prep HPF  POC FEW (*) NONE SEEN   WBC, Wet Prep HPF POC FEW (*) NONE SEEN   12 lead EKG: NSR, rate 73   Assessment and Plan  A:  [redacted]w[redacted]d SIUP  Syncopal episode probably due to hypoglycemic episode from not eating  Abdominal pain during pregnancy  No PNC  P:  Begin PNC at [facility] while awaiting Medicaid card  Continue PNV and Fe supplement  Eat 3 meals/day and carry snacks with you to prevent syncopal episodes  Return to MAU if needed    Marge Duncans, CNM 10/06/2011, 2:41 PM

## 2011-10-06 NOTE — MAU Note (Signed)
Very tender in suprapubic area when listening to FH

## 2011-10-07 LAB — GC/CHLAMYDIA PROBE AMP, GENITAL
Chlamydia, DNA Probe: NEGATIVE
GC Probe Amp, Genital: NEGATIVE

## 2011-10-07 NOTE — MAU Provider Note (Signed)
Agree with above note.  Skyanne Welle 10/07/2011 7:30 AM   

## 2011-11-12 ENCOUNTER — Emergency Department (HOSPITAL_BASED_OUTPATIENT_CLINIC_OR_DEPARTMENT_OTHER)
Admission: EM | Admit: 2011-11-12 | Discharge: 2011-11-12 | Disposition: A | Discharge status: Home / Self Care | Payer: Medicaid Other | Attending: Emergency Medicine | Admitting: Emergency Medicine

## 2011-11-12 ENCOUNTER — Encounter (HOSPITAL_BASED_OUTPATIENT_CLINIC_OR_DEPARTMENT_OTHER): Payer: Self-pay | Admitting: *Deleted

## 2011-11-12 DIAGNOSIS — Z880 Allergy status to penicillin: Secondary | ICD-10-CM | POA: Insufficient documentation

## 2011-11-12 DIAGNOSIS — O99330 Smoking (tobacco) complicating pregnancy, unspecified trimester: Secondary | ICD-10-CM | POA: Insufficient documentation

## 2011-11-12 DIAGNOSIS — J069 Acute upper respiratory infection, unspecified: Secondary | ICD-10-CM | POA: Insufficient documentation

## 2011-11-12 DIAGNOSIS — Z888 Allergy status to other drugs, medicaments and biological substances status: Secondary | ICD-10-CM | POA: Insufficient documentation

## 2011-11-12 DIAGNOSIS — Z3490 Encounter for supervision of normal pregnancy, unspecified, unspecified trimester: Secondary | ICD-10-CM

## 2011-11-12 DIAGNOSIS — O99891 Other specified diseases and conditions complicating pregnancy: Secondary | ICD-10-CM | POA: Insufficient documentation

## 2011-11-12 NOTE — ED Notes (Signed)
Pt amb to room 6 with quick steady gait in nad. Pt reports sinus congestion x 2 days. Denies any fevers or cough.

## 2011-11-12 NOTE — ED Provider Notes (Signed)
History     CSN: [id number]  Arrival date & time 11/12/11  1124   First MD Initiated Contact with Patient 11/12/11 1210      Chief Complaint  Patient presents with  . Nasal Congestion  . Cough    (Consider location/radiation/quality/duration/timing/severity/associated sxs/prior treatment) Patient is a 26 y.o. female presenting with cough. The history is provided by the patient. No language interpreter was used.  Cough This is a new problem. The current episode started more than 2 days ago. The problem occurs constantly. The problem has been gradually worsening. The cough is non-productive. There has been no fever. Associated symptoms include rhinorrhea and sore throat. Pertinent negatives include no ear congestion. She has tried nothing for the symptoms. She is not a smoker. Her past medical history is significant for bronchitis. Her past medical history does not include pneumonia.   Pt complains of congestion and cough, no fever Past Medical History  Diagnosis Date  . No pertinent past medical history   . Anemia   . Syncopal episodes     Past Surgical History  Procedure Date  . No past surgeries     Family History  Problem Relation Age of Onset  . Other Neg Hx   . Asthma Father   . Diabetes Father   . Hypertension Father     History  Substance Use Topics  . Smoking status: Current Some Day Smoker -- 0.2 packs/day  . Smokeless tobacco: Never Used  . Alcohol Use: No    OB History    Grav Para Term Preterm Abortions TAB SAB Ect Mult Living   5 1  1 3  3   1       Review of Systems  HENT: Positive for sore throat and rhinorrhea.   Respiratory: Positive for cough.   All other systems reviewed and are negative.    Allergies  Drospirenone-ethinyl estradiol and Amoxicillin  Home Medications   Current Outpatient Rx  Name Route Sig Dispense Refill  . FERROUS FUMARATE ER 50 MG PO TBCR Oral Take 50 mg by mouth daily.    . MULTI-VITAMIN/MINERALS PO TABS Oral  Take 1 tablet by mouth daily.    Marland Kitchen VITAMIN C 500 MG PO TABS Oral Take 500 mg by mouth daily.      BP 115/73  Pulse 100  Temp 98.1 F (36.7 C) (Oral)  Resp 20  Ht 5\' 2"  (1.575 m)  Wt 135 lb (61.236 kg)  BMI 24.69 kg/m2  SpO2 100%  LMP 06/24/2011  Physical Exam  Nursing note and vitals reviewed. Constitutional: She appears well-developed and well-nourished.  HENT:  Head: Normocephalic and atraumatic.  Eyes: Conjunctivae normal and EOM are normal. Pupils are equal, round, and reactive to light.  Neck: Normal range of motion. Neck supple.  Cardiovascular: Normal rate.   Pulmonary/Chest: Effort normal.  Abdominal: Soft.  Neurological: She is alert.  Skin: Skin is warm.  Psychiatric: She has a normal mood and affect.    ED Course  Procedures (including critical care time)  Labs Reviewed - No data to display No results found.   1. URI (upper respiratory infection)   2. Pregnancy       MDM  Pt counseled on viral illness,   I advised follow up with MD.  Tylenol and         Elson Areas, [place] 11/12/11 1258

## 2011-11-12 NOTE — ED Provider Notes (Signed)
Medical screening examination/treatment/procedure(s) were performed by non-physician practitioner and as supervising physician I was immediately available for consultation/collaboration.  Ethelda Chick, MD 11/12/11 [phone]

## 2011-11-17 LAB — OB RESULTS CONSOLE ABO/RH: RH Type: NEGATIVE

## 2011-11-17 LAB — OB RESULTS CONSOLE RPR: RPR: NONREACTIVE

## 2011-11-17 LAB — OB RESULTS CONSOLE HIV ANTIBODY (ROUTINE TESTING): HIV: NONREACTIVE

## 2011-11-17 LAB — OB RESULTS CONSOLE RUBELLA ANTIBODY, IGM: Rubella: IMMUNE

## 2012-01-06 ENCOUNTER — Encounter (HOSPITAL_COMMUNITY): Payer: Self-pay

## 2012-01-06 ENCOUNTER — Inpatient Hospital Stay (HOSPITAL_COMMUNITY)
Admission: AD | Admit: 2012-01-06 | Discharge: 2012-01-06 | Disposition: A | Discharge status: Home / Self Care | Payer: Medicaid Other | Source: Ambulatory Visit | Attending: Obstetrics and Gynecology | Admitting: Obstetrics and Gynecology

## 2012-01-06 DIAGNOSIS — O212 Late vomiting of pregnancy: Secondary | ICD-10-CM | POA: Insufficient documentation

## 2012-01-06 DIAGNOSIS — O219 Vomiting of pregnancy, unspecified: Secondary | ICD-10-CM

## 2012-01-06 DIAGNOSIS — G4700 Insomnia, unspecified: Secondary | ICD-10-CM

## 2012-01-06 LAB — URINALYSIS, ROUTINE W REFLEX MICROSCOPIC
Glucose, UA: NEGATIVE mg/dL
Hgb urine dipstick: NEGATIVE
Specific Gravity, Urine: <1.005 — ABNORMAL LOW (ref 1.005–1.030)
Urobilinogen, UA: 0.2 mg/dL (ref 0.0–1.0)
pH: 7 (ref 5.0–8.0)

## 2012-01-06 MED ORDER — LACTATED RINGERS IV SOLN
INTRAVENOUS | Status: DC
  Administered 2012-01-06: 15:00:00 via INTRAVENOUS

## 2012-01-06 MED ORDER — ONDANSETRON HCL 4 MG/2ML IJ SOLN
4.0000 mg | Freq: Once | INTRAMUSCULAR | Status: AC
  Administered 2012-01-06: 4 mg via INTRAVENOUS
  Filled 2012-01-06: qty 2

## 2012-01-06 MED ORDER — ZOLPIDEM TARTRATE 5 MG PO TABS: 5.0000 mg | ORAL_TABLET | Freq: Every evening | ORAL | Status: DC | PRN

## 2012-01-06 NOTE — MAU Note (Signed)
Patient reports symptoms for 2 weeks, can't sleep, eat or drink has been through a lot the past 2 weeks, pain is in the bottom of stomach.

## 2012-01-06 NOTE — MAU Provider Note (Signed)
History     CSN: [id number]  Arrival date and time: 01/06/12 1309   First Provider Initiated Contact with Patient 01/06/12 1410      Chief Complaint  Patient presents with  . Nausea  . Emesis  . Abdominal Pain   HPI  Patient reports difficulty sleeping for past two weeks.  Pt states "have been through a lot" with father of baby.  Unable to hold down food or drink for past two days.  Denies fever, body aches, or chills.  +stomach cramping.  No known exposure to ill individuals. No report of vaginal bleeding or leaking of fluid.  +fetal movement.       Past Medical History  Diagnosis Date  . No pertinent past medical history   . Anemia   . Syncopal episodes     Past Surgical History  Procedure Date  . No past surgeries     Family History  Problem Relation Age of Onset  . Other Neg Hx   . Asthma Father   . Diabetes Father   . Hypertension Father     History  Substance Use Topics  . Smoking status: Current Some Day Smoker -- 0.2 packs/day  . Smokeless tobacco: Never Used  . Alcohol Use: No    Allergies:  Allergies  Allergen Reactions  . Amoxicillin Rash    Prescriptions prior to admission  Medication Sig Dispense Refill  . Multiple Vitamins-Minerals (MULTIVITAMIN WITH MINERALS) tablet Take 1 tablet by mouth daily.        Review of Systems  Gastrointestinal: Positive for nausea, vomiting and abdominal pain (cramping).  All other systems reviewed and are negative.   Physical Exam   Blood pressure 116/79, pulse 97, temperature 98.2 F (36.8 C), temperature source Oral, resp. rate 16, last menstrual period 06/24/2011.  Physical Exam  Constitutional: She is oriented to person, place, and time. She appears well-developed and well-nourished. No distress.  HENT:  Head: Normocephalic.  Mouth/Throat: Mucous membranes are dry.  Neck: Normal range of motion. Neck supple.  Cardiovascular: Normal rate, regular rhythm and normal heart sounds.   Respiratory:  Effort normal and breath sounds normal.  GI: There is no tenderness.  Genitourinary: No bleeding around the vagina.  Neurological: She is alert and oriented to person, place, and time. She has normal reflexes.  Skin: Skin is warm and dry. She is not diaphoretic.   FHR 130's, 10x10 Toco - none  MAU Course  Procedures  Results for orders placed during the hospital encounter of 01/06/12 (from the past 24 hour(s))  URINALYSIS, ROUTINE W REFLEX MICROSCOPIC     Status: Abnormal   Collection Time   01/06/12  1:35 PM      Component Value Range   Color, Urine YELLOW  YELLOW   APPearance CLEAR  CLEAR   Specific Gravity, Urine <1.005 (*) 1.005 - 1.030   pH 7.0  5.0 - 8.0   Glucose, UA NEGATIVE  NEGATIVE mg/dL   Hgb urine dipstick NEGATIVE  NEGATIVE   Bilirubin Urine NEGATIVE  NEGATIVE   Ketones, ur NEGATIVE  NEGATIVE mg/dL   Protein, ur NEGATIVE  NEGATIVE mg/dL   Urobilinogen, UA 0.2  0.0 - 1.0 mg/dL   Nitrite NEGATIVE  NEGATIVE   Leukocytes, UA NEGATIVE  NEGATIVE   Consulted with Dr. Tenny Craw > reviewed HPI/exam/labs > IV Fluids and Zofran > DC to home; RX Ambien  Pt given 1L LR w/IV Zofran > reports improvement in symptoms; tolerated crackers and soda  Assessment and Plan  Insomnia Vomiting in Pregnancy  Plan: DC to home Continue Zofran RX Ambien #5 Follow-up in office  [facility] 01/06/2012, 2:11 PM

## 2012-01-06 NOTE — MAU Note (Signed)
Patient denies nausea at present gave patient gingerale and saltine cracker for po challenge.

## 2012-02-25 NOTE — L&D Delivery Note (Signed)
Delivery Note At 8:23 PM a viable and healthy female was delivered via Vaginal, Spontaneous Delivery (Presentation: Left Occiput Anterior after manual rotation from LOP).  APGAR: 8, 9; weight .   Placenta status: Intact, Spontaneous.  Cord: 3 vessels.  Anesthesia: Epidural  Episiotomy: None  Lacerations: None Est. Blood Loss (mL): 350  Mom to postpartum.  Baby to nursery-stable.  [name] 04/19/2012, 8:47 PM

## 2012-02-28 ENCOUNTER — Encounter (HOSPITAL_COMMUNITY): Payer: Self-pay | Admitting: *Deleted

## 2012-02-28 ENCOUNTER — Inpatient Hospital Stay (HOSPITAL_COMMUNITY)
Admission: AD | Admit: 2012-02-28 | Discharge: 2012-02-28 | Disposition: A | Discharge status: Home / Self Care | Payer: Medicaid Other | Source: Ambulatory Visit | Attending: Obstetrics and Gynecology | Admitting: Obstetrics and Gynecology

## 2012-02-28 DIAGNOSIS — R51 Headache: Secondary | ICD-10-CM | POA: Insufficient documentation

## 2012-02-28 DIAGNOSIS — O169 Unspecified maternal hypertension, unspecified trimester: Secondary | ICD-10-CM

## 2012-02-28 DIAGNOSIS — R030 Elevated blood-pressure reading, without diagnosis of hypertension: Secondary | ICD-10-CM | POA: Insufficient documentation

## 2012-02-28 DIAGNOSIS — O99891 Other specified diseases and conditions complicating pregnancy: Secondary | ICD-10-CM | POA: Insufficient documentation

## 2012-02-28 DIAGNOSIS — O139 Gestational [pregnancy-induced] hypertension without significant proteinuria, unspecified trimester: Secondary | ICD-10-CM

## 2012-02-28 HISTORY — DX: Depression, unspecified: F32.A

## 2012-02-28 HISTORY — DX: Anxiety disorder, unspecified: F41.9

## 2012-02-28 HISTORY — DX: Major depressive disorder, single episode, unspecified: F32.9

## 2012-02-28 LAB — COMPREHENSIVE METABOLIC PANEL
AST: 12 U/L (ref 0–37)
Albumin: 2.6 g/dL — ABNORMAL LOW (ref 3.5–5.2)
Alkaline Phosphatase: 90 U/L (ref 39–117)
BUN: 3 mg/dL — ABNORMAL LOW (ref 6–23)
Chloride: 105 mEq/L (ref 96–112)
Potassium: 3.5 mEq/L (ref 3.5–5.1)
Total Bilirubin: 0.1 mg/dL — ABNORMAL LOW (ref 0.3–1.2)

## 2012-02-28 LAB — CBC
HCT: 30.1 % — ABNORMAL LOW (ref 36.0–46.0)
Platelets: 160 10*3/uL (ref 150–400)
RBC: 3.48 MIL/uL — ABNORMAL LOW (ref 3.87–5.11)
RDW: 12.8 % (ref 11.5–15.5)
WBC: 10.9 10*3/uL — ABNORMAL HIGH (ref 4.0–10.5)

## 2012-02-28 LAB — URINALYSIS, ROUTINE W REFLEX MICROSCOPIC
Glucose, UA: NEGATIVE mg/dL
Leukocytes, UA: NEGATIVE
Protein, ur: NEGATIVE mg/dL
Specific Gravity, Urine: 1.002 — ABNORMAL LOW (ref 1.005–1.030)

## 2012-02-28 MED ORDER — ACETAMINOPHEN 325 MG PO TABS
650.0000 mg | ORAL_TABLET | Freq: Once | ORAL | Status: DC
  Filled 2012-02-28: qty 2

## 2012-02-28 NOTE — MAU Provider Note (Signed)
History     CSN: [id number]  Arrival date and time: 02/28/12 1826   First Provider Initiated Contact with Patient 02/28/12 1930      Chief Complaint  Patient presents with  . Hypertension   HPI This is a 27 y.o. female at [redacted]w[redacted]d who presents with c/o headache and elevated BP at home. States this has been happening today. Had preeclampsia with first baby.  Has some white spots in her vision at times. Denies RUQ pain but has sharp intermittent pain in LUQ. + fetal movement Denies leaking or bleeding  RN Note: Patient presents to MAU with c/o headache and high BP (140/96) taken at 1730 at home; states she has not felt well all day and is concerned d/t hx of preeclampsia with last pregnancy.  Reports seeing white floaters today; denies RUQ pain but does have intermittent LUQ which she describes as "sharp hot knife pain".       OB History    Grav Para Term Preterm Abortions TAB SAB Ect Mult Living   5 1  1 3  3   1       Past Medical History  Diagnosis Date  . No pertinent past medical history   . Anemia   . Syncopal episodes   . Infection   . Bacterial vaginosis   . Yeast infection   . Mental disorder   . Depression   . Anxiety     Past Surgical History  Procedure Date  . No past surgeries     Family History  Problem Relation Age of Onset  . Other Neg Hx   . Asthma Father   . Diabetes Father   . Hypertension Father     History  Substance Use Topics  . Smoking status: Current Some Day Smoker -- 0.2 packs/day  . Smokeless tobacco: Never Used  . Alcohol Use: No    Allergies:  Allergies  Allergen Reactions  . Amoxicillin Rash    Prescriptions prior to admission  Medication Sig Dispense Refill  . Prenatal Vit-Fe Fumarate-FA (PRENATAL MULTIVITAMIN) TABS Take 2 tablets by mouth daily.        Review of Systems  Constitutional: Negative for fever and malaise/fatigue.  Eyes: Positive for blurred vision (sees white spots at times).  Respiratory: Negative  for cough.   Cardiovascular: Negative for chest pain.  Gastrointestinal: Positive for abdominal pain (LUQ sharp). Negative for nausea and vomiting.  Neurological: Positive for headaches.   Physical Exam   Blood pressure 122/81, pulse 82, temperature 97.9 F (36.6 C), temperature source Oral, height 5\' 4"  (1.626 m), weight 70.489 kg (155 lb 6.4 oz), last menstrual period 06/24/2011. Filed Vitals:   02/28/12 2002 02/28/12 2017 02/28/12 2032 02/28/12 2047  BP: 128/83 125/80 121/80 122/81  Pulse: 85 79 83 82  Temp:      TempSrc:      Height:      Weight:        Physical Exam  Constitutional: She is oriented to person, place, and time. She appears well-developed and well-nourished. No distress.  HENT:  Head: Normocephalic.  Cardiovascular: Normal rate.   Respiratory: Effort normal.  GI: Soft. She exhibits no distension and no mass. There is tenderness (slightly tender over epigastrum). There is no rebound and no guarding.  Musculoskeletal: Normal range of motion. She exhibits edema (Trace lower extremities).  Neurological: She is alert and oriented to person, place, and time. She has normal reflexes. She displays normal reflexes.  Skin: Skin is warm and  dry.  Psychiatric: She has a normal mood and affect.   FHR reactive with good variability and no decels UCs irregular and mild, every 4-10 minutes  Results for orders placed during the hospital encounter of 02/28/12 (from the past 24 hour(s))  CBC     Status: Abnormal   Collection Time   02/28/12  6:20 PM      Component Value Range   WBC 10.9 (*) 4.0 - 10.5 K/uL   RBC 3.48 (*) 3.87 - 5.11 MIL/uL   Hemoglobin 10.1 (*) 12.0 - 15.0 g/dL   HCT 16.1 (*) 09.6 - 04.5 %   MCV 86.5  78.0 - 100.0 fL   MCH 29.0  26.0 - 34.0 pg   MCHC 33.6  30.0 - 36.0 g/dL   RDW 40.9  81.1 - 91.4 %   Platelets 160  150 - 400 K/uL  COMPREHENSIVE METABOLIC PANEL     Status: Abnormal   Collection Time   02/28/12  6:20 PM      Component Value Range    Sodium 138  135 - 145 mEq/L   Potassium 3.5  3.5 - 5.1 mEq/L   Chloride 105  96 - 112 mEq/L   CO2 24  19 - 32 mEq/L   Glucose, Bld 68 (*) 70 - 99 mg/dL   BUN 3 (*) 6 - 23 mg/dL   Creatinine, Ser 7.82  0.50 - 1.10 mg/dL   Calcium 8.5  8.4 - 95.6 mg/dL   Total Protein 5.7 (*) 6.0 - 8.3 g/dL   Albumin 2.6 (*) 3.5 - 5.2 g/dL   AST 12  0 - 37 U/L   ALT 10  0 - 35 U/L   Alkaline Phosphatase 90  39 - 117 U/L   Total Bilirubin 0.1 (*) 0.3 - 1.2 mg/dL   GFR calc non Af Amer >90  >90 mL/min   GFR calc Af Amer >90  >90 mL/min  URINALYSIS, ROUTINE W REFLEX MICROSCOPIC     Status: Abnormal   Collection Time   02/28/12  6:40 PM      Component Value Range   Color, Urine YELLOW  YELLOW   APPearance CLEAR  CLEAR   Specific Gravity, Urine 1.002 (*) 1.005 - 1.030   pH 6.5  5.0 - 8.0   Glucose, UA NEGATIVE  NEGATIVE mg/dL   Hgb urine dipstick NEGATIVE  NEGATIVE   Bilirubin Urine NEGATIVE  NEGATIVE   Ketones, ur NEGATIVE  NEGATIVE mg/dL   Protein, ur NEGATIVE  NEGATIVE mg/dL   Urobilinogen, UA 0.2  0.0 - 1.0 mg/dL   Nitrite NEGATIVE  NEGATIVE   Leukocytes, UA NEGATIVE  NEGATIVE  PROTEIN / CREATININE RATIO, URINE     Status: Abnormal   Collection Time   02/28/12  6:40 PM      Component Value Range   Creatinine, Urine 6.19     Total Protein, Urine 1.2     PROTEIN CREATININE RATIO 0.19 (*) 0.00 - 0.15    MAU Course  Procedures  MDM Discussed with Dr Henderson Cloud >> send UA.  PO hydration to stop contractions. Call her back with more BP measurements  UA Urine Neg Protein  2137: Pt is no longer complaining of a headache, contractions have resolved with PO hydration and labs are negative Called Dr. Henderson Cloud. Pt is ok for DC home. FU as planned in the office.  Assessment and Plan  Care turned over to H. Brandice Busser CNM  1. Elevated blood pressure complicating pregnancy, antepartum  Pre-X danger signs reviewed Third trimester precautions given FU with the office as planned, return if symptoms  worsen  Tawnya Crook 02/28/2012, 9:37 PM

## 2012-02-28 NOTE — MAU Note (Signed)
Patient presents to MAU with c/o headache and high BP (140/96) taken at 1730 at home; states she has not felt well all day and is concerned d/t hx of preeclampsia with last pregnancy.   Reports seeing white floaters today; denies RUQ pain but does have intermittent LUQ which she describes as "sharp hot knife pain".

## 2012-02-28 NOTE — MAU Note (Addendum)
Pt reports she checked her b/p at home and it was 140/96. Had PIH with last pregnancy. Pt reports she is more swollen today and has mild headache as well.

## 2012-03-05 ENCOUNTER — Encounter (HOSPITAL_COMMUNITY): Payer: Self-pay | Admitting: Family

## 2012-03-05 ENCOUNTER — Inpatient Hospital Stay (HOSPITAL_COMMUNITY)
Admission: AD | Admit: 2012-03-05 | Discharge: 2012-03-05 | Disposition: A | Discharge status: Home / Self Care | Payer: Medicaid Other | Source: Ambulatory Visit | Attending: Obstetrics and Gynecology | Admitting: Obstetrics and Gynecology

## 2012-03-05 DIAGNOSIS — R030 Elevated blood-pressure reading, without diagnosis of hypertension: Secondary | ICD-10-CM | POA: Insufficient documentation

## 2012-03-05 DIAGNOSIS — O99891 Other specified diseases and conditions complicating pregnancy: Secondary | ICD-10-CM | POA: Insufficient documentation

## 2012-03-05 DIAGNOSIS — O368190 Decreased fetal movements, unspecified trimester, not applicable or unspecified: Secondary | ICD-10-CM

## 2012-03-05 NOTE — MAU Note (Signed)
Patient presents to MAU from [facility] where she was seen today; reports she was having NST for elevated pressures. Patient reports headache and blurred vision since 0800 today; has not taken anything for pain.  Reports fetal movement within last two hours, although states it is less than normal fetal movement today. Denies vaginal bleeding/discharge; Patient states she was taken out of work on Tuesday for elevated BPs at which time today's NST was scheduled.

## 2012-03-05 NOTE — MAU Provider Note (Signed)
Chief Complaint:  Hypertension   First Provider Initiated Contact with Patient 03/05/12 1729      HPI: Jeanne Lyons is a 27 y.o. [id number] at [redacted]w[redacted]d who was sent from office  NST.  She presented for scheduled NST in the office indicated by recently elevated BPs.  She reported decreased FM. She also has general malaise "feeling like passing out,"  had a H/A since waking up which is now "almost gone" without tx, RUQ pain x 2-3 days and visual disturbance "eyes felt like trying to cross" earlier today. Home BPs labile is being followed for BP elevations at recent visits. Seen in MAU 02/29/12 and plts were 160k, LFTs WNL,P:C ratio .19.   Denies contractions, leakage of fluid or vaginal bleeding. Good fetal movement since arrival here.   Pregnancy Course: Hx IOL preE @ 35 wks  Past Medical History: Past Medical History  Diagnosis Date  . No pertinent past medical history   . Anemia   . Syncopal episodes   . Infection   . Bacterial vaginosis   . Yeast infection   . Mental disorder   . Depression   . Anxiety     Past obstetric history: OB History    Grav Para Term Preterm Abortions TAB SAB Ect Mult Living   5 1  1 3  3   1      # Outc Date GA Lbr Len/2nd Wgt Sex Del Anes PTL Lv   1 PRE 2008 [redacted]w[redacted]d   M SVD EPI  Yes   Comments: precclampsia - induction, infant in NICU for 4 weeks   2 SAB            3 SAB            4 SAB            5 CUR               Past Surgical History: Past Surgical History  Procedure Date  . No past surgeries     Family History: Family History  Problem Relation Age of Onset  . Other Neg Hx   . Asthma Father   . Diabetes Father   . Hypertension Father     Social History: History  Substance Use Topics  . Smoking status: Current Some Day Smoker -- 0.2 packs/day  . Smokeless tobacco: Never Used  . Alcohol Use: No    Allergies:  Allergies  Allergen Reactions  . Amoxicillin Rash    Meds:  Prescriptions prior to admission  Medication Sig  Dispense Refill  . acetaminophen (TYLENOL) 325 MG tablet Take 650 mg by mouth every 6 (six) hours as needed. pain      . calcium carbonate (TUMS - DOSED IN MG ELEMENTAL CALCIUM) 500 MG chewable tablet Chew 1 tablet by mouth daily as needed. Heart burn      . Prenatal Vit-Fe Fumarate-FA (PRENATAL MULTIVITAMIN) TABS Take 2 tablets by mouth daily.        ROS: Pertinent findings in history of present illness.  Physical Exam  Blood pressure 120/84, pulse 90, temperature 97.4 F (36.3 C), temperature source Oral, resp. rate 16, height 5\' 6"  (1.676 m), weight 156 lb 3.2 oz (70.852 kg), last menstrual period 06/24/2011, SpO2 100.00%. BP range 120-127/80-86.  GENERAL: Well-developed, well-nourished female in no acute distress.  HEENT: normocephalic HEART: normal rate RESP: normal effort ABDOMEN: Soft, non-tender, gravid appropriate for gestational age EXTREMITIES: Nontender, no edema NEURO: alert and oriented  FHT:  Baseline 120-125 ,  moderate variability, accelerations present, no decelerations Contractions:none   Labs: No results found for this or any previous visit (from the past 24 hour(s)).  Imaging:  No results found. MAU Course: EFM reactive  Assessment: 1. Elevated BP   2. Decreased fetal movement in pregnancy, antepartum   History elevated BPs, normotensive at today's visit Category 1 FHR  Plan: C/W Dr. Tenny Craw and reviewed history, serial BPs and PE, EFM. Discharge home with preeclampsia precautions. Call office in 3 days to schedule appt. AVS fetal kick counts  Follow-up Information    Follow up with Almon Hercules., MD. Call on 03/08/2012.   Contact information:   [address] [place] [address] [phone]           Medication List     As of 03/05/2012  6:02 PM    TAKE these medications         acetaminophen 325 MG tablet   Commonly known as: TYLENOL   Take 650 mg by mouth every 6 (six) hours as needed. pain      calcium carbonate 500  MG chewable tablet   Commonly known as: TUMS - dosed in mg elemental calcium   Chew 1 tablet by mouth daily as needed. Heart burn      prenatal multivitamin Tabs   Take 2 tablets by mouth daily.         Danae Orleans, CNM 03/05/2012 5:34 PM

## 2012-03-05 NOTE — MAU Note (Signed)
Patient states she was sent from the office for evaluation. Patient states she has been having epigastric pain, headaches and visual changes. Reports less than usual fetal movement. Reports yellowish leaking and no bleeding.

## 2012-03-08 ENCOUNTER — Inpatient Hospital Stay (HOSPITAL_COMMUNITY)
Admission: AD | Admit: 2012-03-08 | Discharge: 2012-03-08 | Disposition: A | Discharge status: Home / Self Care | Payer: Medicaid Other | Source: Ambulatory Visit | Attending: Obstetrics and Gynecology | Admitting: Obstetrics and Gynecology

## 2012-03-08 ENCOUNTER — Encounter (HOSPITAL_COMMUNITY): Payer: Self-pay

## 2012-03-08 DIAGNOSIS — R42 Dizziness and giddiness: Secondary | ICD-10-CM | POA: Insufficient documentation

## 2012-03-08 DIAGNOSIS — O26899 Other specified pregnancy related conditions, unspecified trimester: Secondary | ICD-10-CM

## 2012-03-08 DIAGNOSIS — O99891 Other specified diseases and conditions complicating pregnancy: Secondary | ICD-10-CM | POA: Insufficient documentation

## 2012-03-08 DIAGNOSIS — R030 Elevated blood-pressure reading, without diagnosis of hypertension: Secondary | ICD-10-CM | POA: Insufficient documentation

## 2012-03-08 DIAGNOSIS — R51 Headache: Secondary | ICD-10-CM | POA: Insufficient documentation

## 2012-03-08 LAB — COMPREHENSIVE METABOLIC PANEL
ALT: 12 U/L (ref 0–35)
AST: 17 U/L (ref 0–37)
Alkaline Phosphatase: 97 U/L (ref 39–117)
Calcium: 8.7 mg/dL (ref 8.4–10.5)
Glucose, Bld: 78 mg/dL (ref 70–99)
Potassium: 3.6 mEq/L (ref 3.5–5.1)
Sodium: 137 mEq/L (ref 135–145)
Total Protein: 5.9 g/dL — ABNORMAL LOW (ref 6.0–8.3)

## 2012-03-08 LAB — URINALYSIS, ROUTINE W REFLEX MICROSCOPIC
Glucose, UA: NEGATIVE mg/dL
Hgb urine dipstick: NEGATIVE
Leukocytes, UA: NEGATIVE
Protein, ur: NEGATIVE mg/dL
Specific Gravity, Urine: <1.005 — ABNORMAL LOW (ref 1.005–1.030)
Urobilinogen, UA: 0.2 mg/dL (ref 0.0–1.0)

## 2012-03-08 LAB — CBC WITH DIFFERENTIAL/PLATELET
Basophils Absolute: 0 10*3/uL (ref 0.0–0.1)
Basophils Relative: 0 % (ref 0–1)
Eosinophils Absolute: 0.1 10*3/uL (ref 0.0–0.7)
Eosinophils Relative: 1 % (ref 0–5)
HCT: 30.4 % — ABNORMAL LOW (ref 36.0–46.0)
Hemoglobin: 10 g/dL — ABNORMAL LOW (ref 12.0–15.0)
MCH: 28.4 pg (ref 26.0–34.0)
MCHC: 32.9 g/dL (ref 30.0–36.0)
MCV: 86.4 fL (ref 78.0–100.0)
Monocytes Absolute: 1 10*3/uL (ref 0.1–1.0)
Monocytes Relative: 8 % (ref 3–12)
Neutro Abs: 8.7 10*3/uL — ABNORMAL HIGH (ref 1.7–7.7)
RDW: 13.2 % (ref 11.5–15.5)

## 2012-03-08 NOTE — MAU Provider Note (Signed)
History     CSN: [id number]  Arrival date and time: 03/08/12 1208   First Provider Initiated Contact with Patient 03/08/12 1251      Chief Complaint  Patient presents with  . Hypertension  . Dizziness   HPIHolly D Lyons is 27 y.o. [id number] [redacted]w[redacted]d weeks presenting with 2 week history "blood pressures up and down".   Readings are still high at home at different times of the day.   She reports headache, dizziness and spots in her vision, nausea that coincided with elevated BP readings at home.   Interferes in her ability to eat--last ate at 10am vomited it up.   She is a patient of Dr.  Nita Sells.  He took her out of work 1 week ago.  She was here on 1/4 and 1/10 with PIH workup on that date.  Labs were normal.  BPs normal.  Delivered first pregnancy at 35weeks for pre-eclampsia.  Denies vaginal bleeding or loss of fluid.  +fetal movement perceived by patient.  Has appt for Friday.  She understands she is to be seen 2X week.     Past Medical History  Diagnosis Date  . No pertinent past medical history   . Anemia   . Syncopal episodes   . Infection   . Bacterial vaginosis   . Yeast infection   . Mental disorder   . Depression   . Anxiety     Past Surgical History  Procedure Date  . No past surgeries     Family History  Problem Relation Age of Onset  . Other Neg Hx   . Asthma Father   . Diabetes Father   . Hypertension Father     History  Substance Use Topics  . Smoking status: Current Some Day Smoker -- 0.2 packs/day  . Smokeless tobacco: Never Used  . Alcohol Use: No    Allergies:  Allergies  Allergen Reactions  . Amoxicillin Rash    Prescriptions prior to admission  Medication Sig Dispense Refill  . acetaminophen (TYLENOL) 325 MG tablet Take 650 mg by mouth every 6 (six) hours as needed. pain      . calcium carbonate (TUMS - DOSED IN MG ELEMENTAL CALCIUM) 500 MG chewable tablet Chew 1 tablet by mouth daily as needed. Heart burn      . Prenatal Vit-Fe  Fumarate-FA (PRENATAL MULTIVITAMIN) TABS Take 2 tablets by mouth daily.        Review of Systems  Eyes: Blurred vision: spots before her eyes. Eye redness: neg for vaginal bleeding or discharge or loss of fluid.  Gastrointestinal: Positive for nausea and vomiting.  Neurological: Positive for dizziness and headaches.   Physical Exam   Blood pressure 125/85, pulse 98, temperature 98.2 F (36.8 C), temperature source Oral, resp. rate 18, height 5' 5.75" (1.67 m), weight 155 lb (70.308 kg), last menstrual period 06/24/2011.  Physical Exam  Nursing note and vitals reviewed. Constitutional: She is oriented to person, place, and time. She appears well-developed and well-nourished. No distress.  HENT:  Head: Normocephalic.  Cardiovascular: Normal rate.   Respiratory: Effort normal.  GI:       indicated  Genitourinary:       Not indicated  Musculoskeletal: She exhibits no edema.  Neurological: She is alert and oriented to person, place, and time.  Skin: Skin is warm and dry.  Psychiatric: She has a normal mood and affect. Her behavior is normal.   Results for orders placed during the hospital encounter of 03/08/12 (from  the past 24 hour(s))  URINALYSIS, ROUTINE W REFLEX MICROSCOPIC     Status: Abnormal   Collection Time   03/08/12 12:21 PM      Component Value Range   Color, Urine YELLOW  YELLOW   APPearance CLEAR  CLEAR   Specific Gravity, Urine <1.005 (*) 1.005 - 1.030   pH 6.0  5.0 - 8.0   Glucose, UA NEGATIVE  NEGATIVE mg/dL   Hgb urine dipstick NEGATIVE  NEGATIVE   Bilirubin Urine NEGATIVE  NEGATIVE   Ketones, ur NEGATIVE  NEGATIVE mg/dL   Protein, ur NEGATIVE  NEGATIVE mg/dL   Urobilinogen, UA 0.2  0.0 - 1.0 mg/dL   Nitrite NEGATIVE  NEGATIVE   Leukocytes, UA NEGATIVE  NEGATIVE  COMPREHENSIVE METABOLIC PANEL     Status: Abnormal   Collection Time   03/08/12  1:35 PM      Component Value Range   Sodium 137  135 - 145 mEq/L   Potassium 3.6  3.5 - 5.1 mEq/L   Chloride 103   96 - 112 mEq/L   CO2 23  19 - 32 mEq/L   Glucose, Bld 78  70 - 99 mg/dL   BUN 4 (*) 6 - 23 mg/dL   Creatinine, Ser 1.61  0.50 - 1.10 mg/dL   Calcium 8.7  8.4 - 09.6 mg/dL   Total Protein 5.9 (*) 6.0 - 8.3 g/dL   Albumin 2.7 (*) 3.5 - 5.2 g/dL   AST 17  0 - 37 U/L   ALT 12  0 - 35 U/L   Alkaline Phosphatase 97  39 - 117 U/L   Total Bilirubin 0.2 (*) 0.3 - 1.2 mg/dL   GFR calc non Af Amer >90  >90 mL/min   GFR calc Af Amer >90  >90 mL/min  CBC WITH DIFFERENTIAL     Status: Abnormal   Collection Time   03/08/12  1:35 PM      Component Value Range   WBC 11.7 (*) 4.0 - 10.5 K/uL   RBC 3.52 (*) 3.87 - 5.11 MIL/uL   Hemoglobin 10.0 (*) 12.0 - 15.0 g/dL   HCT 04.5 (*) 40.9 - 81.1 %   MCV 86.4  78.0 - 100.0 fL   MCH 28.4  26.0 - 34.0 pg   MCHC 32.9  30.0 - 36.0 g/dL   RDW 91.4  78.2 - 95.6 %   Platelets 181  150 - 400 K/uL   Neutrophils Relative 74  43 - 77 %   Neutro Abs 8.7 (*) 1.7 - 7.7 K/uL   Lymphocytes Relative 17  12 - 46 %   Lymphs Abs 1.9  0.7 - 4.0 K/uL   Monocytes Relative 8  3 - 12 %   Monocytes Absolute 1.0  0.1 - 1.0 K/uL   Eosinophils Relative 1  0 - 5 %   Eosinophils Absolute 0.1  0.0 - 0.7 K/uL   Basophils Relative 0  0 - 1 %   Basophils Absolute 0.0  0.0 - 0.1 K/uL  URIC ACID     Status: Normal   Collection Time   03/08/12  1:35 PM      Component Value Range   Uric Acid, Serum 3.7  2.4 - 7.0 mg/dL  LACTATE DEHYDROGENASE     Status: Normal   Collection Time   03/08/12  1:35 PM      Component Value Range   LDH 224  94 - 250 U/L   MAU Course  Procedures   Serial blood  pressures wnl  MDM 13:13  Reported MSE, UA and BPs, FMS to Dr. Dareen Piano.  Reported patient's concerns.  PIH labs ordered. Dr. Dareen Piano in to see patient.  Came out and gave order for Tylenol if she hasn't had any in the last 4 hours.  If labs are WNL, patient may be discharged to home with instructions to follow up in the office in 2 days.   Patient reports that she took Tylenol prior to checking  in.  Will not give additional medication  Assessment and Plan  A:  Reported elevated blood pressure readings at home--normal here       Dizziness and headache      PIH ruled out     Hx of preterm delivery at 35 weeks for pre-eclampsia   KEY,EVE M 03/08/2012, 12:54 PM

## 2012-03-08 NOTE — MAU Note (Signed)
BP has been high, was here on Fri, was told to call office this morning- they told her to come here.  Has had headaches off and on , n/v and dizzy when has headaches.  Eyes have been blurry, like a glaze goes over eyes and has been seeing spots.

## 2012-03-18 LAB — OB RESULTS CONSOLE GBS: GBS: NEGATIVE

## 2012-03-24 ENCOUNTER — Inpatient Hospital Stay (HOSPITAL_COMMUNITY): Payer: Medicaid Other

## 2012-03-24 ENCOUNTER — Encounter (HOSPITAL_COMMUNITY): Payer: Self-pay | Admitting: *Deleted

## 2012-03-24 ENCOUNTER — Inpatient Hospital Stay (HOSPITAL_COMMUNITY)
Admission: AD | Admit: 2012-03-24 | Discharge: 2012-03-24 | Disposition: A | Discharge status: Home / Self Care | Payer: Medicaid Other | Source: Ambulatory Visit | Attending: Obstetrics and Gynecology | Admitting: Obstetrics and Gynecology

## 2012-03-24 DIAGNOSIS — O99891 Other specified diseases and conditions complicating pregnancy: Secondary | ICD-10-CM | POA: Insufficient documentation

## 2012-03-24 DIAGNOSIS — O368190 Decreased fetal movements, unspecified trimester, not applicable or unspecified: Secondary | ICD-10-CM | POA: Insufficient documentation

## 2012-03-24 DIAGNOSIS — J00 Acute nasopharyngitis [common cold]: Secondary | ICD-10-CM | POA: Insufficient documentation

## 2012-03-24 DIAGNOSIS — O139 Gestational [pregnancy-induced] hypertension without significant proteinuria, unspecified trimester: Secondary | ICD-10-CM | POA: Insufficient documentation

## 2012-03-24 NOTE — MAU Note (Signed)
Patient states she has had only one fetal movement today. Denies any contractions at this time, denies bleeding or leaking. Has cold like symptoms.

## 2012-03-24 NOTE — MAU Provider Note (Signed)
History     CSN: [id number]  Arrival date and time: 03/24/12 1210   First Provider Initiated Contact with Patient 03/24/12 1301      Chief Complaint  Patient presents with  . Decreased Fetal Movement  . URI   HPI This is a 27 y.o. female at [redacted]w[redacted]d who presents with report of decreased fetal movement since yesterday. States feels a little movement today, none yesterday. Has had cold symptoms for a few days. Does not feel contractions.  Denies headache or visual changes. Denies new edema. History preeclampsia with early delivery (35w) last baby. Has had some BP elevations this pregnancy. Labs done twice with normal results.  RN Note: Patient states she has had only one fetal movement today. Denies any contractions at this time, denies bleeding or leaking. Has cold like symptoms.       OB History    Grav Para Term Preterm Abortions TAB SAB Ect Mult Living   5 1  1 3  3   1       Past Medical History  Diagnosis Date  . No pertinent past medical history   . Anemia   . Syncopal episodes   . Infection   . Bacterial vaginosis   . Yeast infection   . Mental disorder   . Depression   . Anxiety     Past Surgical History  Procedure Date  . No past surgeries     Family History  Problem Relation Age of Onset  . Other Neg Hx   . Asthma Father   . Diabetes Father   . Hypertension Father     History  Substance Use Topics  . Smoking status: Current Some Day Smoker -- 0.2 packs/day  . Smokeless tobacco: Never Used  . Alcohol Use: No    Allergies:  Allergies  Allergen Reactions  . Amoxicillin Rash    Prescriptions prior to admission  Medication Sig Dispense Refill  . calcium carbonate (TUMS - DOSED IN MG ELEMENTAL CALCIUM) 500 MG chewable tablet Chew 1 tablet by mouth daily as needed. Heart burn      . diphenhydrAMINE (BENADRYL) 12.5 MG/5ML elixir Take 12.5 mg by mouth once.      Marland Kitchen guaiFENesin-dextromethorphan (ROBITUSSIN DM) 100-10 MG/5ML syrup Take 5 mLs by mouth  once.      . Prenatal Vit-Fe Fumarate-FA (PRENATAL MULTIVITAMIN) TABS Take 2 tablets by mouth daily.        Review of Systems  Constitutional: Positive for malaise/fatigue. Negative for fever and chills.  HENT: Positive for congestion and sore throat.   Respiratory: Positive for cough. Negative for sputum production, shortness of breath and wheezing.   Gastrointestinal: Negative for nausea, vomiting, abdominal pain, diarrhea and constipation.  Genitourinary: Negative for dysuria.       Decreased fetal movement  Neurological: Negative for headaches.   Physical Exam   Blood pressure 129/92, pulse 103, temperature 99 F (37.2 C), temperature source Oral, resp. rate 18, height 5\' 6"  (1.676 m), weight 161 lb 9.6 oz (73.301 kg), last menstrual period 06/24/2011, SpO2 98.00%.  Physical Exam  Constitutional: She appears well-developed and well-nourished. No distress.  HENT:  Head: Normocephalic.  Cardiovascular: Normal rate.   Respiratory: Effort normal.  GI: Soft. She exhibits no distension and no mass. There is no tenderness. There is no rebound and no guarding.    MAU Course  Procedures  MDM NST nonreactive, though reassuring with variability, BPP ordered  >>  BPP score = 8/8 Afterward, NST became reactive  Assessment and Plan  A:  SIUP at [redacted]w[redacted]d       Decreased FM with reassuring NST and BPP      Gestational hypertension  P:  Discussed with Dr Dareen Piano      Discharge home      Has appt this week, keep appt      Preeclampsia precautions (pt well aware of signs)  [facility] 03/24/2012, 2:52 PM

## 2012-03-25 NOTE — MAU Provider Note (Signed)
.  jd

## 2012-04-12 ENCOUNTER — Encounter (HOSPITAL_COMMUNITY): Payer: Self-pay | Admitting: *Deleted

## 2012-04-12 ENCOUNTER — Telehealth (HOSPITAL_COMMUNITY): Payer: Self-pay | Admitting: *Deleted

## 2012-04-12 NOTE — Telephone Encounter (Signed)
Preadmission screen  

## 2012-04-16 ENCOUNTER — Telehealth (HOSPITAL_COMMUNITY): Payer: Self-pay | Admitting: *Deleted

## 2012-04-16 NOTE — Telephone Encounter (Signed)
Preadmission screen  

## 2012-04-19 ENCOUNTER — Encounter (HOSPITAL_COMMUNITY): Payer: Self-pay | Admitting: *Deleted

## 2012-04-19 ENCOUNTER — Encounter (HOSPITAL_COMMUNITY): Payer: Self-pay | Admitting: Anesthesiology

## 2012-04-19 ENCOUNTER — Inpatient Hospital Stay (HOSPITAL_COMMUNITY): Payer: Medicaid Other | Admitting: Anesthesiology

## 2012-04-19 ENCOUNTER — Inpatient Hospital Stay (HOSPITAL_COMMUNITY)
Admission: AD | Admit: 2012-04-19 | Discharge: 2012-04-21 | DRG: 775 | Disposition: A | Discharge status: Home / Self Care | Payer: Medicaid Other | Source: Ambulatory Visit | Attending: Obstetrics & Gynecology | Admitting: Obstetrics & Gynecology

## 2012-04-19 LAB — COMPREHENSIVE METABOLIC PANEL
AST: 13 U/L (ref 0–37)
Albumin: 3 g/dL — ABNORMAL LOW (ref 3.5–5.2)
Calcium: 8.9 mg/dL (ref 8.4–10.5)
Creatinine, Ser: 0.57 mg/dL (ref 0.50–1.10)
GFR calc non Af Amer: >90 mL/min (ref >90)
Sodium: 135 mEq/L (ref 135–145)
Total Protein: 6.6 g/dL (ref 6.0–8.3)

## 2012-04-19 LAB — CBC
HCT: 35.1 % — ABNORMAL LOW (ref 36.0–46.0)
Hemoglobin: 11.1 g/dL — ABNORMAL LOW (ref 12.0–15.0)
MCH: 26.2 pg (ref 26.0–34.0)
MCV: 83 fL (ref 78.0–100.0)
RBC: 4.23 MIL/uL (ref 3.87–5.11)

## 2012-04-19 LAB — LACTATE DEHYDROGENASE: LDH: 202 U/L (ref 94–250)

## 2012-04-19 MED ORDER — LIDOCAINE HCL (PF) 1 % IJ SOLN
INTRAMUSCULAR | Status: DC | PRN
  Administered 2012-04-19 (×2): 5 mL

## 2012-04-19 MED ORDER — ONDANSETRON HCL 4 MG PO TABS: 4.0000 mg | ORAL_TABLET | ORAL | Status: DC | PRN

## 2012-04-19 MED ORDER — ACETAMINOPHEN 325 MG PO TABS: 650.0000 mg | ORAL_TABLET | ORAL | Status: DC | PRN

## 2012-04-19 MED ORDER — FENTANYL 2.5 MCG/ML BUPIVACAINE 1/10 % EPIDURAL INFUSION (WH - ANES)
INTRAMUSCULAR | Status: DC | PRN
  Administered 2012-04-19: 14 mL/h via EPIDURAL

## 2012-04-19 MED ORDER — LACTATED RINGERS IV SOLN: 500.0000 mL | Freq: Once | INTRAVENOUS | Status: DC

## 2012-04-19 MED ORDER — PHENYLEPHRINE 40 MCG/ML (10ML) SYRINGE FOR IV PUSH (FOR BLOOD PRESSURE SUPPORT): 80.0000 ug | PREFILLED_SYRINGE | INTRAVENOUS | Status: DC | PRN

## 2012-04-19 MED ORDER — BUTORPHANOL TARTRATE 1 MG/ML IJ SOLN
1.0000 mg | Freq: Once | INTRAMUSCULAR | Status: AC
  Administered 2012-04-19: 1 mg via INTRAVENOUS
  Filled 2012-04-19: qty 1

## 2012-04-19 MED ORDER — OXYCODONE-ACETAMINOPHEN 5-325 MG PO TABS: 1.0000 | ORAL_TABLET | ORAL | Status: DC | PRN

## 2012-04-19 MED ORDER — DIPHENHYDRAMINE HCL 50 MG/ML IJ SOLN: 12.5000 mg | INTRAMUSCULAR | Status: DC | PRN

## 2012-04-19 MED ORDER — LACTATED RINGERS IV SOLN
INTRAVENOUS | Status: DC
  Administered 2012-04-19: 19:00:00 via INTRAVENOUS

## 2012-04-19 MED ORDER — ZOLPIDEM TARTRATE 5 MG PO TABS: 5.0000 mg | ORAL_TABLET | Freq: Every evening | ORAL | Status: DC | PRN

## 2012-04-19 MED ORDER — TETANUS-DIPHTH-ACELL PERTUSSIS 5-2.5-18.5 LF-MCG/0.5 IM SUSP: 0.5000 mL | Freq: Once | INTRAMUSCULAR | Status: DC

## 2012-04-19 MED ORDER — DIBUCAINE 1 % RE OINT: 1.0000 "application " | TOPICAL_OINTMENT | RECTAL | Status: DC | PRN

## 2012-04-19 MED ORDER — DIPHENHYDRAMINE HCL 25 MG PO CAPS: 25.0000 mg | ORAL_CAPSULE | Freq: Four times a day (QID) | ORAL | Status: DC | PRN

## 2012-04-19 MED ORDER — EPHEDRINE 5 MG/ML INJ
10.0000 mg | INTRAVENOUS | Status: DC | PRN
  Filled 2012-04-19: qty 4

## 2012-04-19 MED ORDER — OXYTOCIN 40 UNITS IN LACTATED RINGERS INFUSION - SIMPLE MED
62.5000 mL/h | INTRAVENOUS | Status: DC
  Filled 2012-04-19: qty 1000

## 2012-04-19 MED ORDER — ONDANSETRON HCL 4 MG/2ML IJ SOLN: 4.0000 mg | INTRAMUSCULAR | Status: DC | PRN

## 2012-04-19 MED ORDER — LANOLIN HYDROUS EX OINT: TOPICAL_OINTMENT | CUTANEOUS | Status: DC | PRN

## 2012-04-19 MED ORDER — NIFEDIPINE 10 MG PO CAPS
10.0000 mg | ORAL_CAPSULE | Freq: Three times a day (TID) | ORAL | Status: DC
  Administered 2012-04-19 – 2012-04-20 (×3): 10 mg via ORAL
  Filled 2012-04-19 (×3): qty 1

## 2012-04-19 MED ORDER — PHENYLEPHRINE 40 MCG/ML (10ML) SYRINGE FOR IV PUSH (FOR BLOOD PRESSURE SUPPORT)
80.0000 ug | PREFILLED_SYRINGE | INTRAVENOUS | Status: DC | PRN
  Filled 2012-04-19: qty 5

## 2012-04-19 MED ORDER — IBUPROFEN 600 MG PO TABS
600.0000 mg | ORAL_TABLET | Freq: Four times a day (QID) | ORAL | Status: DC
  Administered 2012-04-20 – 2012-04-21 (×6): 600 mg via ORAL
  Filled 2012-04-19 (×7): qty 1

## 2012-04-19 MED ORDER — PRENATAL MULTIVITAMIN CH
1.0000 | ORAL_TABLET | Freq: Every day | ORAL | Status: DC
  Administered 2012-04-20 – 2012-04-21 (×2): 1 via ORAL
  Filled 2012-04-19 (×2): qty 1

## 2012-04-19 MED ORDER — WITCH HAZEL-GLYCERIN EX PADS: 1.0000 "application " | MEDICATED_PAD | CUTANEOUS | Status: DC | PRN

## 2012-04-19 MED ORDER — CITRIC ACID-SODIUM CITRATE 334-500 MG/5ML PO SOLN: 30.0000 mL | ORAL | Status: DC | PRN

## 2012-04-19 MED ORDER — IBUPROFEN 600 MG PO TABS
600.0000 mg | ORAL_TABLET | Freq: Four times a day (QID) | ORAL | Status: DC | PRN
  Administered 2012-04-19: 600 mg via ORAL
  Filled 2012-04-19: qty 1

## 2012-04-19 MED ORDER — OXYCODONE-ACETAMINOPHEN 5-325 MG PO TABS
1.0000 | ORAL_TABLET | ORAL | Status: DC | PRN
  Administered 2012-04-20 (×5): 1 via ORAL
  Filled 2012-04-19 (×6): qty 1

## 2012-04-19 MED ORDER — OXYTOCIN BOLUS FROM INFUSION
500.0000 mL | INTRAVENOUS | Status: DC
  Administered 2012-04-19: 500 mL via INTRAVENOUS

## 2012-04-19 MED ORDER — EPHEDRINE 5 MG/ML INJ: 10.0000 mg | INTRAVENOUS | Status: DC | PRN

## 2012-04-19 MED ORDER — LIDOCAINE HCL (PF) 1 % IJ SOLN
30.0000 mL | INTRAMUSCULAR | Status: DC | PRN
  Filled 2012-04-19: qty 30

## 2012-04-19 MED ORDER — SENNOSIDES-DOCUSATE SODIUM 8.6-50 MG PO TABS
2.0000 | ORAL_TABLET | Freq: Every day | ORAL | Status: DC
  Administered 2012-04-20: 2 via ORAL

## 2012-04-19 MED ORDER — LACTATED RINGERS IV SOLN: 500.0000 mL | INTRAVENOUS | Status: DC | PRN

## 2012-04-19 MED ORDER — ONDANSETRON HCL 4 MG/2ML IJ SOLN
4.0000 mg | Freq: Four times a day (QID) | INTRAMUSCULAR | Status: DC | PRN
  Administered 2012-04-19: 4 mg via INTRAVENOUS
  Filled 2012-04-19: qty 2

## 2012-04-19 MED ORDER — SIMETHICONE 80 MG PO CHEW: 80.0000 mg | CHEWABLE_TABLET | ORAL | Status: DC | PRN

## 2012-04-19 MED ORDER — BENZOCAINE-MENTHOL 20-0.5 % EX AERO: 1.0000 "application " | INHALATION_SPRAY | CUTANEOUS | Status: DC | PRN

## 2012-04-19 MED ORDER — FENTANYL 2.5 MCG/ML BUPIVACAINE 1/10 % EPIDURAL INFUSION (WH - ANES)
14.0000 mL/h | INTRAMUSCULAR | Status: DC
  Filled 2012-04-19: qty 125

## 2012-04-19 MED ORDER — FLEET ENEMA 7-19 GM/118ML RE ENEM: 1.0000 | ENEMA | RECTAL | Status: DC | PRN

## 2012-04-19 NOTE — Anesthesia Preprocedure Evaluation (Signed)
Anesthesia Evaluation  Patient identified by MRN, date of birth, ID band Patient awake    Reviewed: Allergy & Precautions, H&P , Patient's Chart, lab work & pertinent test results  Airway Mallampati: II TM Distance: >3 FB Neck ROM: full    Dental no notable dental hx.    Pulmonary neg pulmonary ROS,  breath sounds clear to auscultation  Pulmonary exam normal       Cardiovascular negative cardio ROS  Rhythm:regular Rate:Normal     Neuro/Psych negative neurological ROS  negative psych ROS   GI/Hepatic negative GI ROS, Neg liver ROS,   Endo/Other  negative endocrine ROS  Renal/GU negative Renal ROS     Musculoskeletal   Abdominal   Peds  Hematology negative hematology ROS (+)   Anesthesia Other Findings No pertinent past medical history     Anemia        Syncopal episodes     Infection        Bacterial vaginosis     Yeast infection        Mental disorder     Depression        Anxiety     Abnormal Pap smear        Hx of varicella    Reproductive/Obstetrics (+) Pregnancy                           Anesthesia Physical Anesthesia Plan  ASA: II  Anesthesia Plan: Epidural   Post-op Pain Management:    Induction:   Airway Management Planned:   Additional Equipment:   Intra-op Plan:   Post-operative Plan:   Informed Consent: I have reviewed the patients History and Physical, chart, labs and discussed the procedure including the risks, benefits and alternatives for the proposed anesthesia with the patient or authorized representative who has indicated his/her understanding and acceptance.     Plan Discussed with:   Anesthesia Plan Comments:         Anesthesia Quick Evaluation

## 2012-04-19 NOTE — Anesthesia Procedure Notes (Signed)
Epidural Patient location during procedure: OB Start time: 04/19/2012 5:03 PM  Staffing Anesthesiologist: Angus Seller., Harrell Gave. Performed by: anesthesiologist   Preanesthetic Checklist Completed: patient identified, site marked, surgical consent, pre-op evaluation, timeout performed, IV checked, risks and benefits discussed and monitors and equipment checked  Epidural Patient position: sitting Prep: site prepped and draped and DuraPrep Patient monitoring: continuous pulse ox and blood pressure Approach: midline Injection technique: LOR air and LOR saline  Needle:  Needle type: Tuohy  Needle gauge: 17 G Needle length: 9 cm and 9 Needle insertion depth: 5 cm cm Catheter type: closed end flexible Catheter size: 19 Gauge Catheter at skin depth: 10 cm Test dose: negative  Assessment Events: blood not aspirated, injection not painful, no injection resistance, negative IV test and no paresthesia  Additional Notes Patient identified.  Risk benefits discussed including failed block, incomplete pain control, headache, nerve damage, paralysis, blood pressure changes, nausea, vomiting, reactions to medication both toxic or allergic, and postpartum back pain.  Patient expressed understanding and wished to proceed.  All questions were answered.  Sterile technique used throughout procedure and epidural site dressed with sterile barrier dressing. No paresthesia or other complications noted.The patient did not experience any signs of intravascular injection such as tinnitus or metallic taste in mouth nor signs of intrathecal spread such as rapid motor block. Please see nursing notes for vital signs.

## 2012-04-19 NOTE — H&P (Signed)
27 y.o. [id number]  Estimated Date of Delivery: 04/11/12 admitted at 41/[redacted] weeks gestation in labor.  Prenatal Transfer Tool  Maternal Diabetes: No Genetic Screening: Declined Maternal Ultrasounds/Referrals: Normal Fetal Ultrasounds or other Referrals:  None Maternal Substance Abuse:  No Significant Maternal Medications:  None Significant Maternal Lab Results: Lab values include: Rh negative received prophylactic RhoGAM at 28 weeks Other Significant Pregnancy Complications:  None  Afebrile, VSS Heart and Lungs: No active disease Abdomen: soft, gravid, EFW AGA. Cervical exam:  4.5/90  Impression: Active labor  Plan:  Observe for vaginal delivery

## 2012-04-19 NOTE — Consult Note (Signed)
Called to attend this vaginal delivery for [facility]. Delivery team was excused by OB and L&D nurse right after they arrived in Room 165 since infant was crying vigorously.    Overton Mam, MD (Attending Neonatologist)

## 2012-04-20 LAB — CBC
HCT: 28.1 % — ABNORMAL LOW (ref 36.0–46.0)
Hemoglobin: 9.3 g/dL — ABNORMAL LOW (ref 12.0–15.0)
MCH: 27 pg (ref 26.0–34.0)
RBC: 3.45 MIL/uL — ABNORMAL LOW (ref 3.87–5.11)

## 2012-04-20 MED ORDER — RHO D IMMUNE GLOBULIN 1500 UNIT/2ML IJ SOLN
300.0000 ug | Freq: Once | INTRAMUSCULAR | Status: AC
  Administered 2012-04-20: 300 ug via INTRAMUSCULAR
  Filled 2012-04-20: qty 2

## 2012-04-20 NOTE — Anesthesia Postprocedure Evaluation (Signed)
  Anesthesia Post-op Note  Patient: Jeanne Lyons  Procedure(s) Performed: * No procedures listed *  Patient Location: PACU and Mother/Baby  Anesthesia Type:Epidural  Level of Consciousness: awake, alert  and oriented  Airway and Oxygen Therapy: Patient Spontanous Breathing  Post-op Pain: mild  Post-op Assessment: Patient's Cardiovascular Status Stable, Respiratory Function Stable, No signs of Nausea or vomiting, Adequate PO intake, Pain level controlled, No headache, No backache, No residual numbness and No residual motor weakness  Post-op Vital Signs: stable  Complications: No apparent anesthesia complications

## 2012-04-20 NOTE — Progress Notes (Signed)
Patient is eating, ambulating, voiding.  Pain control is good.  Filed Vitals:   04/19/12 2200 04/19/12 2300 04/20/12 0300 04/20/12 0557  BP: 136/78 128/74 129/81 109/72  Pulse: 97 99 77 73  Temp: 98.4 F (36.9 C) 98.4 F (36.9 C) 98.9 F (37.2 C) 98.4 F (36.9 C)  TempSrc: Oral Oral Oral Oral  Resp: 20 20 20 18   Height:      Weight:      SpO2:   98%     Fundus firm Perineum without swelling.  Lab Results  Component Value Date   WBC 15.1* 04/20/2012   HGB 9.3* 04/20/2012   HCT 28.1* 04/20/2012   MCV 81.4 04/20/2012   PLT 157 04/20/2012    A/Negative/-- (09/23 0000)/RI  A/P Post partum day 1.  Routine care.  Expect d/c tomorrow.   Parents desires circumsision but will have it done in the office.  Baby is Rh +- Rhogam today.  Neel Buffone A

## 2012-04-20 NOTE — Progress Notes (Signed)
Patient was referred for history of depression/anxiety. * Referral screened out by Clinical Social Worker because none of the following criteria appear to apply: ~ History of anxiety/depression during this pregnancy, or of post-partum depression. ~ Diagnosis of anxiety and/or depression within last 3 years ~ History of depression due to pregnancy loss/loss of child OR * Patient's symptoms currently being treated with medication and/or therapy. Please contact the Clinical Social Worker if needs arise, or if patient requests.  CSW reviewed MOB's PNR which does not note a hx of depression.  CSW spoke to bedside RN who states no concerns at this time.  She also was not aware of a hx.  CSW requested that RN contact CSW if concerns arise and she agreed. 

## 2012-04-21 ENCOUNTER — Inpatient Hospital Stay (HOSPITAL_COMMUNITY): Admission: RE | Admit: 2012-04-21 | Payer: Medicaid Other | Source: Ambulatory Visit

## 2012-04-21 LAB — RH IG WORKUP (INCLUDES ABO/RH)
ABO/RH(D): A NEG
DAT, IgG: NEGATIVE
Fetal Screen: NEGATIVE
Gestational Age(Wks): 41

## 2012-04-21 NOTE — Discharge Summary (Signed)
Obstetric Discharge Summary Reason for Admission: onset of labor Prenatal Procedures: none Intrapartum Procedures: spontaneous vaginal delivery Postpartum Procedures: none Complications-Operative and Postpartum: none Hemoglobin  Date Value Range Status  04/20/2012 9.3* 12.0 - 15.0 g/dL Final     HCT  Date Value Range Status  04/20/2012 28.1* 36.0 - 46.0 % Final    Physical Exam:  General: alert and cooperative Lochia: appropriate Uterine Fundus: firm DVT Evaluation: No evidence of DVT seen on physical exam.  Discharge Diagnoses: Term Pregnancy-delivered  Discharge Information: Date: 04/21/2012 Activity: pelvic rest Diet: routine Medications: PNV and Ibuprofen Condition: stable Instructions: refer to practice specific booklet Discharge to: home Follow-up Information   Follow up with [name], MD In 4 weeks.   Contact information:   [address] [place] [address] [phone]       Newborn Data: Live born female  Birth Weight: 6 lb 13.3 oz (3099 g) APGAR: 8, 9  Home with mother.  Jeanne Lyons 04/21/2012, 9:44 AM

## 2012-05-18 ENCOUNTER — Other Ambulatory Visit: Payer: Self-pay

## 2012-09-24 HISTORY — PX: CERVICAL BIOPSY  W/ LOOP ELECTRODE EXCISION: SUR135

## 2013-02-24 NOTE — L&D Delivery Note (Signed)
Pt was admitted with SROM and labor. She rapidly completed the first stage. She pushed for 15 min and then had a SVD of one viable white infant over an intact perineum in the ROA position. Placenta S/I . EBL-400cc. Baby to NBN.

## 2013-07-26 LAB — OB RESULTS CONSOLE ANTIBODY SCREEN: Antibody Screen: NEGATIVE

## 2013-07-26 LAB — OB RESULTS CONSOLE ABO/RH: RH Type: NEGATIVE

## 2013-07-26 LAB — OB RESULTS CONSOLE HIV ANTIBODY (ROUTINE TESTING): HIV: NONREACTIVE

## 2013-07-26 LAB — OB RESULTS CONSOLE GC/CHLAMYDIA
CHLAMYDIA, DNA PROBE: NEGATIVE
Gonorrhea: NEGATIVE

## 2013-07-26 LAB — OB RESULTS CONSOLE RPR: RPR: NONREACTIVE

## 2013-07-26 LAB — OB RESULTS CONSOLE RUBELLA ANTIBODY, IGM: Rubella: IMMUNE

## 2013-07-26 LAB — OB RESULTS CONSOLE HEPATITIS B SURFACE ANTIGEN: Hepatitis B Surface Ag: NEGATIVE

## 2013-10-20 IMAGING — US US FETAL BPP W/O NONSTRESS
1 series · 10 of 10 positions shown · non-contrast
Comparison: none

[Series 1: us fetal bpp w/o nonstress · non-contrast · 10 acquisitions, 10 frames shown]
[im 1/10]
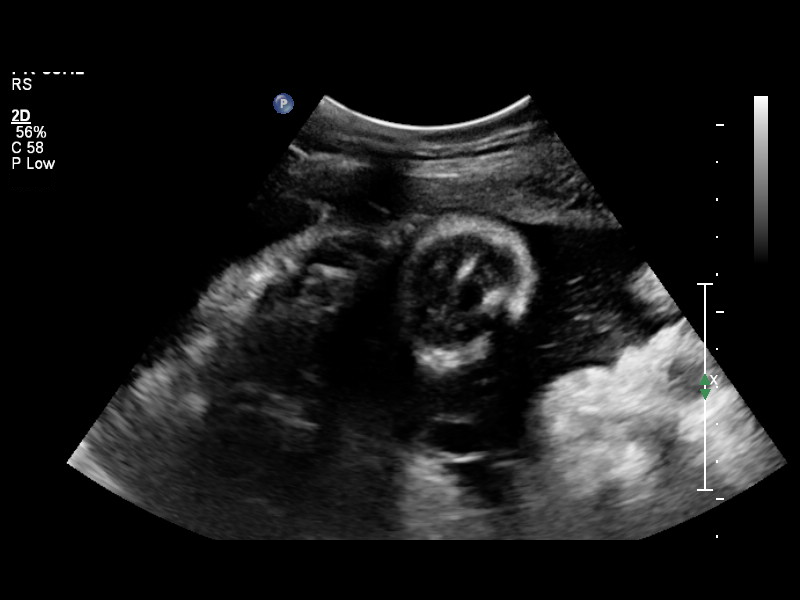
[im 2/10]
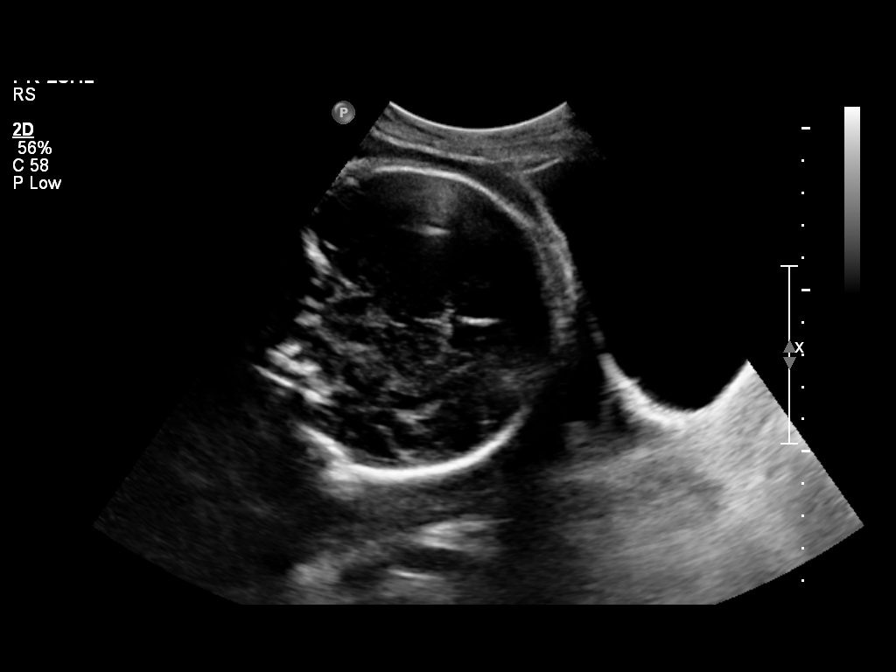
[im 3/10]
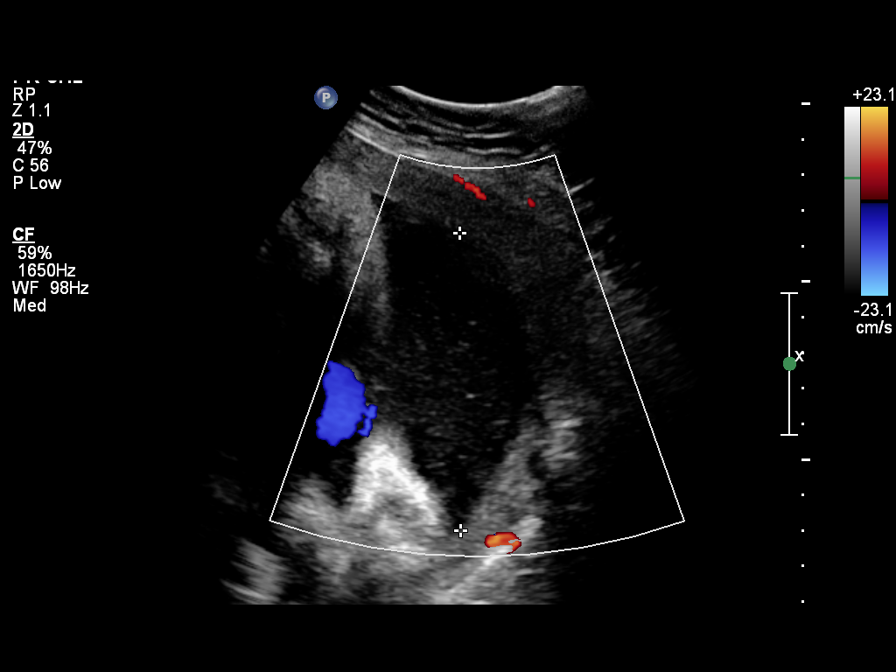
[im 4/10]
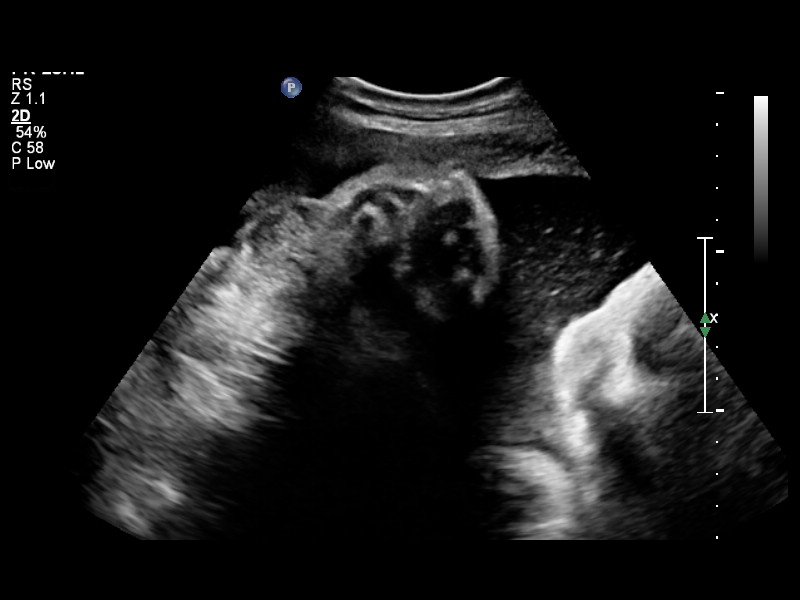
[im 5/10]
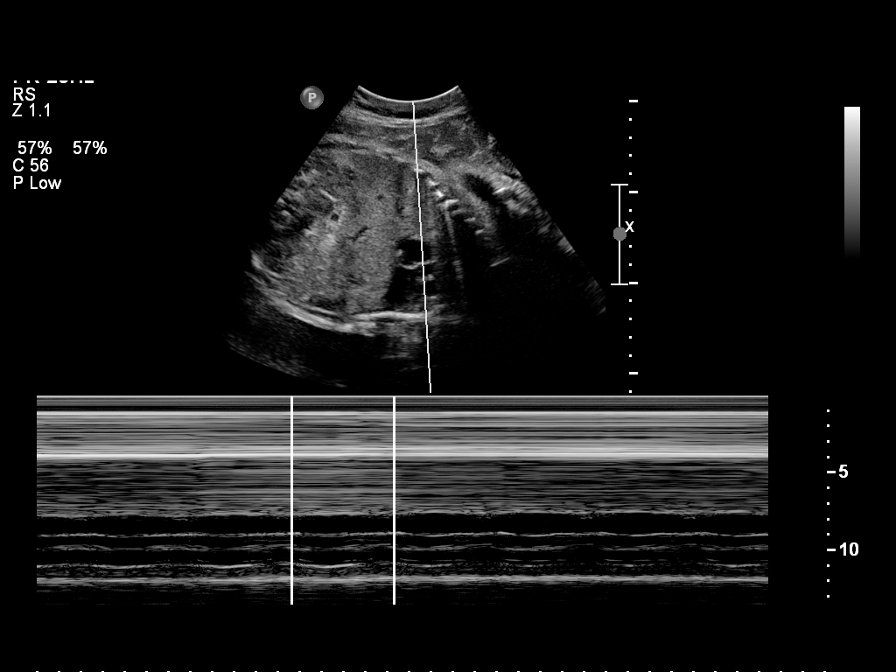
[im 6/10]
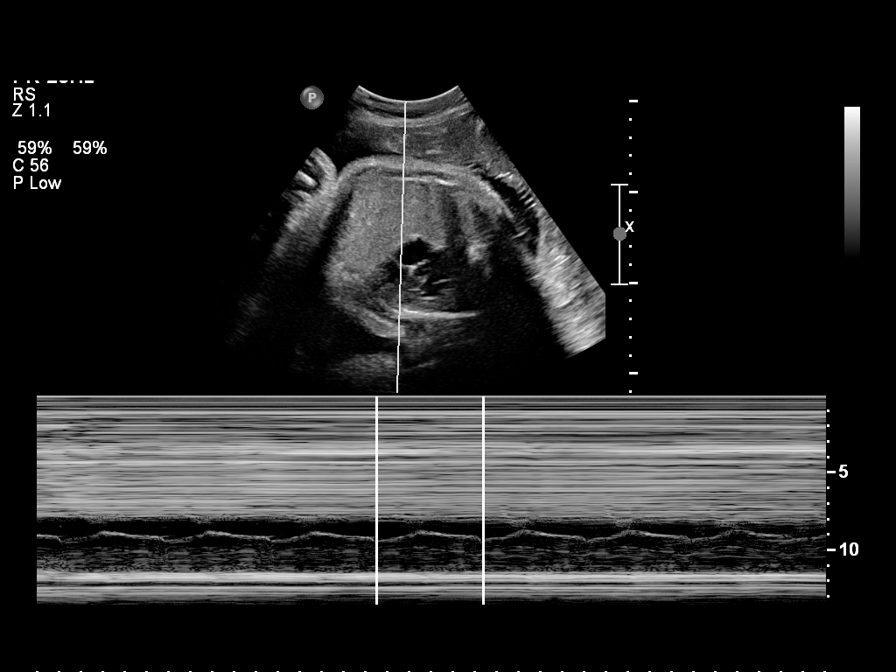
[im 7/10]
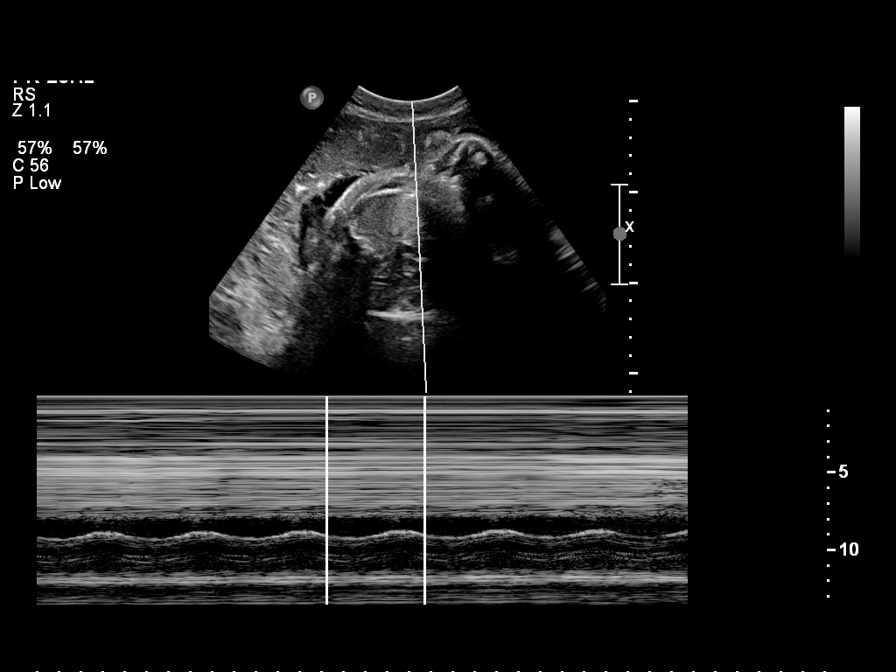
[im 8/10]
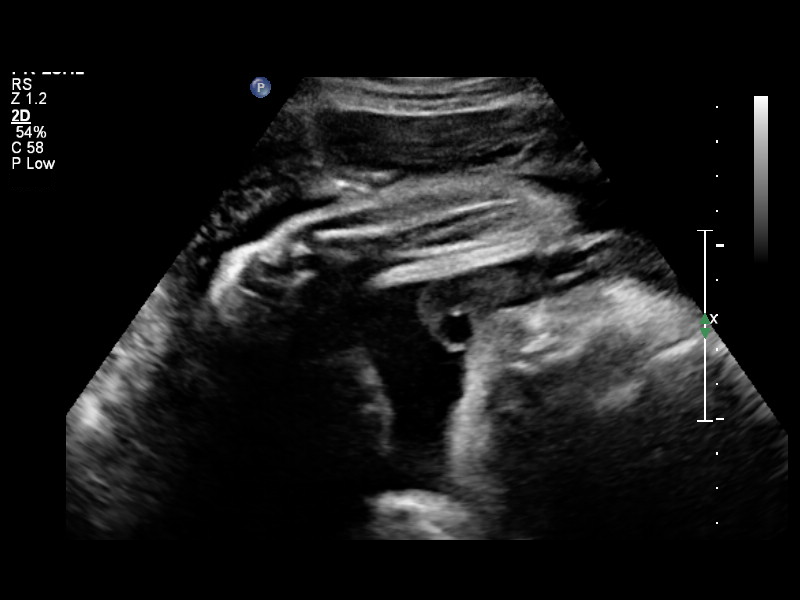
[im 9/10]
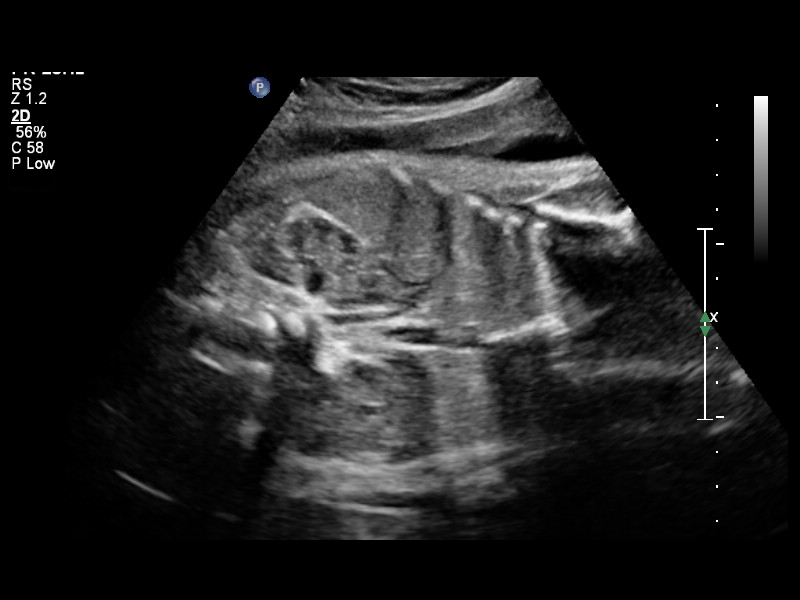
[im 10/10]
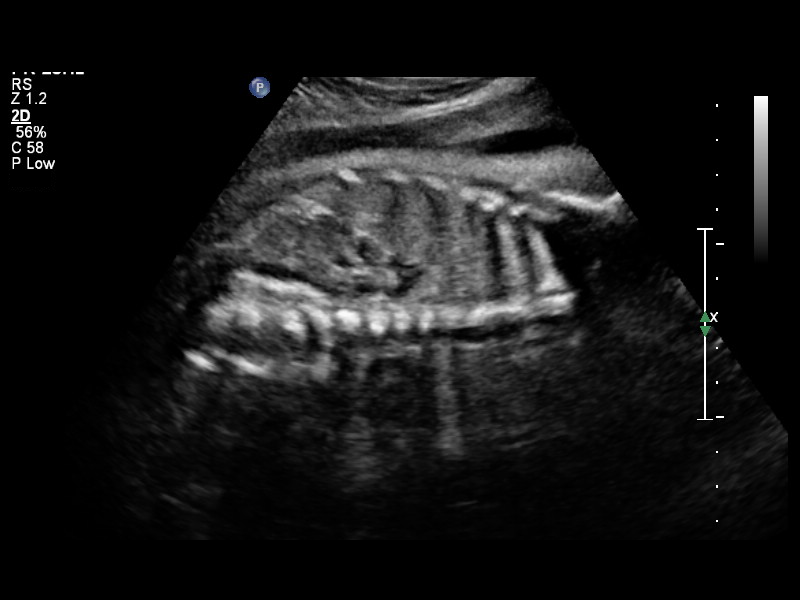

[10 of 10 positions shown; findings below may reference images not displayed]

OBSTETRICS REPORT
                      (Signed Final 03/24/2012 [DATE])

Service(s) Provided

Indications

 Asses fetal well-being
 Decreased fetal movement
 Poor obstetric history: Previous preeclampsia
 Hypertension - Gestational
 Maternal Anemia
 Non-reactive NST
Fetal Evaluation

 Num Of Fetuses:    1
 Fetal Heart Rate:  134                         bpm
 Cardiac Activity:  Observed
 Presentation:      Cephalic

 Amniotic Fluid
 AFI FV:      Subjectively within normal limits
                                             Larg Pckt:     8.3  cm
Biophysical Evaluation

 Amniotic F.V:   Pocket => 2 cm two         F. Tone:        Observed
                 planes
 F. Movement:    Observed                   Score:          [DATE]
 F. Breathing:   Observed
Gestational Age

 LMP:           39w 1d       Date:   06/24/11                 EDD:   03/30/12
 Best:          37w 3d    Det. By:   U/S C R L (10/06/11)     EDD:   04/11/12
Impression

 Biophysical profile score of [DATE].

 questions or concerns.

## 2013-12-26 ENCOUNTER — Encounter (HOSPITAL_COMMUNITY): Payer: Self-pay | Admitting: *Deleted

## 2014-01-23 LAB — OB RESULTS CONSOLE GBS: GBS: NEGATIVE

## 2014-02-06 ENCOUNTER — Encounter (HOSPITAL_COMMUNITY): Payer: Self-pay

## 2014-02-06 ENCOUNTER — Inpatient Hospital Stay (HOSPITAL_COMMUNITY)
Admission: AD | Admit: 2014-02-06 | Discharge: 2014-02-06 | Disposition: A | Discharge status: Home / Self Care | Payer: Medicaid Other | Source: Ambulatory Visit | Attending: Obstetrics and Gynecology | Admitting: Obstetrics and Gynecology

## 2014-02-06 DIAGNOSIS — Z3A39 39 weeks gestation of pregnancy: Secondary | ICD-10-CM | POA: Insufficient documentation

## 2014-02-06 DIAGNOSIS — O471 False labor at or after 37 completed weeks of gestation: Secondary | ICD-10-CM | POA: Insufficient documentation

## 2014-02-06 NOTE — Discharge Instructions (Signed)
Fetal Movement Counts °Patient Name: __________________________________________________ Patient Due Date: ____________________ °Performing a fetal movement count is highly recommended in high-risk pregnancies, but it is good for every pregnant woman to do. Your health care provider may ask you to start counting fetal movements at 28 weeks of the pregnancy. Fetal movements often increase: °· After eating a full meal. °· After physical activity. °· After eating or drinking something sweet or cold. °· At rest. °Pay attention to when you feel the baby is most active. This will help you notice a pattern of your baby's sleep and wake cycles and what factors contribute to an increase in fetal movement. It is important to perform a fetal movement count at the same time each day when your baby is normally most active.  °HOW TO COUNT FETAL MOVEMENTS °1. Find a quiet and comfortable area to sit or lie down on your left side. Lying on your left side provides the best blood and oxygen circulation to your baby. °2. Write down the day and time on a sheet of paper or in a journal. °3. Start counting kicks, flutters, swishes, rolls, or jabs in a 2-hour period. You should feel at least 10 movements within 2 hours. °4. If you do not feel 10 movements in 2 hours, wait 2-3 hours and count again. Look for a change in the pattern or not enough counts in 2 hours. °SEEK MEDICAL CARE IF: °· You feel less than 10 counts in 2 hours, tried twice. °· There is no movement in over an hour. °· The pattern is changing or taking longer each day to reach 10 counts in 2 hours. °· You feel the baby is not moving as he or she usually does. °Date: ____________ Movements: ____________ Start time: ____________ Finish time: ____________  °Date: ____________ Movements: ____________ Start time: ____________ Finish time: ____________ °Date: ____________ Movements: ____________ Start time: ____________ Finish time: ____________ °Date: ____________ Movements:  ____________ Start time: ____________ Finish time: ____________ °Date: ____________ Movements: ____________ Start time: ____________ Finish time: ____________ °Date: ____________ Movements: ____________ Start time: ____________ Finish time: ____________ °Date: ____________ Movements: ____________ Start time: ____________ Finish time: ____________ °Date: ____________ Movements: ____________ Start time: ____________ Finish time: ____________  °Date: ____________ Movements: ____________ Start time: ____________ Finish time: ____________ °Date: ____________ Movements: ____________ Start time: ____________ Finish time: ____________ °Date: ____________ Movements: ____________ Start time: ____________ Finish time: ____________ °Date: ____________ Movements: ____________ Start time: ____________ Finish time: ____________ °Date: ____________ Movements: ____________ Start time: ____________ Finish time: ____________ °Date: ____________ Movements: ____________ Start time: ____________ Finish time: ____________ °Date: ____________ Movements: ____________ Start time: ____________ Finish time: ____________  °Date: ____________ Movements: ____________ Start time: ____________ Finish time: ____________ °Date: ____________ Movements: ____________ Start time: ____________ Finish time: ____________ °Date: ____________ Movements: ____________ Start time: ____________ Finish time: ____________ °Date: ____________ Movements: ____________ Start time: ____________ Finish time: ____________ °Date: ____________ Movements: ____________ Start time: ____________ Finish time: ____________ °Date: ____________ Movements: ____________ Start time: ____________ Finish time: ____________ °Date: ____________ Movements: ____________ Start time: ____________ Finish time: ____________  °Date: ____________ Movements: ____________ Start time: ____________ Finish time: ____________ °Date: ____________ Movements: ____________ Start time: ____________ Finish  time: ____________ °Date: ____________ Movements: ____________ Start time: ____________ Finish time: ____________ °Date: ____________ Movements: ____________ Start time: ____________ Finish time: ____________ °Date: ____________ Movements: ____________ Start time: ____________ Finish time: ____________ °Date: ____________ Movements: ____________ Start time: ____________ Finish time: ____________ °Date: ____________ Movements: ____________ Start time: ____________ Finish time: ____________  °Date: ____________ Movements: ____________ Start time: ____________ Finish   time: ____________ °Date: ____________ Movements: ____________ Start time: ____________ Finish time: ____________ °Date: ____________ Movements: ____________ Start time: ____________ Finish time: ____________ °Date: ____________ Movements: ____________ Start time: ____________ Finish time: ____________ °Date: ____________ Movements: ____________ Start time: ____________ Finish time: ____________ °Date: ____________ Movements: ____________ Start time: ____________ Finish time: ____________ °Date: ____________ Movements: ____________ Start time: ____________ Finish time: ____________  °Date: ____________ Movements: ____________ Start time: ____________ Finish time: ____________ °Date: ____________ Movements: ____________ Start time: ____________ Finish time: ____________ °Date: ____________ Movements: ____________ Start time: ____________ Finish time: ____________ °Date: ____________ Movements: ____________ Start time: ____________ Finish time: ____________ °Date: ____________ Movements: ____________ Start time: ____________ Finish time: ____________ °Date: ____________ Movements: ____________ Start time: ____________ Finish time: ____________ °Date: ____________ Movements: ____________ Start time: ____________ Finish time: ____________  °Date: ____________ Movements: ____________ Start time: ____________ Finish time: ____________ °Date: ____________  Movements: ____________ Start time: ____________ Finish time: ____________ °Date: ____________ Movements: ____________ Start time: ____________ Finish time: ____________ °Date: ____________ Movements: ____________ Start time: ____________ Finish time: ____________ °Date: ____________ Movements: ____________ Start time: ____________ Finish time: ____________ °Date: ____________ Movements: ____________ Start time: ____________ Finish time: ____________ °Date: ____________ Movements: ____________ Start time: ____________ Finish time: ____________  °Date: ____________ Movements: ____________ Start time: ____________ Finish time: ____________ °Date: ____________ Movements: ____________ Start time: ____________ Finish time: ____________ °Date: ____________ Movements: ____________ Start time: ____________ Finish time: ____________ °Date: ____________ Movements: ____________ Start time: ____________ Finish time: ____________ °Date: ____________ Movements: ____________ Start time: ____________ Finish time: ____________ °Date: ____________ Movements: ____________ Start time: ____________ Finish time: ____________ °Document Released: 03/12/2006 Document Revised: 06/27/2013 Document Reviewed: 12/08/2011 °Exit[employer]® Patient Information ©2015 [employer]. This information is not intended to replace advice given to you by your health care provider. Make sure you discuss any questions you have with your health care provider. °Braxton Hicks Contractions °Contractions of the uterus can occur throughout pregnancy. Contractions are not always a sign that you are in labor.  °WHAT ARE BRAXTON HICKS CONTRACTIONS?  °Contractions that occur before labor are called Braxton Hicks contractions, or false labor. Toward the end of pregnancy (32-34 weeks), these contractions can develop more often and may become more forceful. This is not true labor because these contractions do not result in opening (dilatation) and thinning of the cervix. They  are sometimes difficult to tell apart from true labor because these contractions can be forceful and people have different pain tolerances. You should not feel embarrassed if you go to the hospital with false labor. Sometimes, the only way to tell if you are in true labor is for your health care provider to look for changes in the cervix. °If there are no prenatal problems or other health problems associated with the pregnancy, it is completely safe to be sent home with false labor and await the onset of true labor. °HOW CAN YOU TELL THE DIFFERENCE BETWEEN TRUE AND FALSE LABOR? °False Labor °· The contractions of false labor are usually shorter and not as hard as those of true labor.   °· The contractions are usually irregular.   °· The contractions are often felt in the front of the lower abdomen and in the groin.   °· The contractions may go away when you walk around or change positions while lying down.   °· The contractions get weaker and are shorter lasting as time goes on.   °· The contractions do not usually become progressively stronger, regular, and closer together as with true labor.   °True Labor °5. Contractions in true labor last 30-70 seconds, become   very regular, usually become more intense, and increase in frequency.   °6. The contractions do not go away with walking.   °7. The discomfort is usually felt in the top of the uterus and spreads to the lower abdomen and low back.   °8. True labor can be determined by your health care provider with an exam. This will show that the cervix is dilating and getting thinner.   °WHAT TO REMEMBER °· Keep up with your usual exercises and follow other instructions given by your health care provider.   °· Take medicines as directed by your health care provider.   °· Keep your regular prenatal appointments.   °· Eat and drink lightly if you think you are going into labor.   °· If Braxton Hicks contractions are making you uncomfortable:   °· Change your position from  lying down or resting to walking, or from walking to resting.   °· Sit and rest in a tub of warm water.   °· Drink 2-3 glasses of water. Dehydration may cause these contractions.   °· Do slow and deep breathing several times an hour.   °WHEN SHOULD I SEEK IMMEDIATE MEDICAL CARE? °Seek immediate medical care if: °· Your contractions become stronger, more regular, and closer together.   °· You have fluid leaking or gushing from your vagina.   °· You have a fever.   °· You pass blood-tinged mucus.   °· You have vaginal bleeding.   °· You have continuous abdominal pain.   °· You have low back pain that you never had before.   °· You feel your baby's head pushing down and causing pelvic pressure.   °· Your baby is not moving as much as it used to.   °Document Released: 02/10/2005 Document Revised: 02/15/2013 Document Reviewed: 11/22/2012 °ExitCare® Patient Information ©2015 [employer]. This information is not intended to replace advice given to you by your health care provider. Make sure you discuss any questions you have with your health care provider. ° °

## 2014-02-06 NOTE — MAU Note (Signed)
Pt reports contractions since 2300

## 2014-02-07 ENCOUNTER — Inpatient Hospital Stay (HOSPITAL_COMMUNITY): Payer: Medicaid Other | Admitting: Anesthesiology

## 2014-02-07 ENCOUNTER — Inpatient Hospital Stay (HOSPITAL_COMMUNITY)
Admission: AD | Admit: 2014-02-07 | Discharge: 2014-02-08 | DRG: 775 | Disposition: A | Discharge status: Home / Self Care | Payer: Medicaid Other | Source: Ambulatory Visit | Attending: Obstetrics and Gynecology | Admitting: Obstetrics and Gynecology

## 2014-02-07 ENCOUNTER — Encounter (HOSPITAL_COMMUNITY): Payer: Self-pay | Admitting: *Deleted

## 2014-02-07 DIAGNOSIS — Z3480 Encounter for supervision of other normal pregnancy, unspecified trimester: Secondary | ICD-10-CM

## 2014-02-07 DIAGNOSIS — Z3A39 39 weeks gestation of pregnancy: Secondary | ICD-10-CM | POA: Diagnosis present

## 2014-02-07 DIAGNOSIS — Z3483 Encounter for supervision of other normal pregnancy, third trimester: Secondary | ICD-10-CM | POA: Diagnosis present

## 2014-02-07 LAB — CBC
HCT: 28.5 % — ABNORMAL LOW (ref 36.0–46.0)
HEMOGLOBIN: 8.8 g/dL — AB (ref 12.0–15.0)
MCH: 21.6 pg — AB (ref 26.0–34.0)
MCHC: 30.9 g/dL (ref 30.0–36.0)
MCV: 70 fL — ABNORMAL LOW (ref 78.0–100.0)
PLATELETS: 197 10*3/uL (ref 150–400)
RBC: 4.07 MIL/uL (ref 3.87–5.11)
RDW: 18.3 % — ABNORMAL HIGH (ref 11.5–15.5)
WBC: 10.4 10*3/uL (ref 4.0–10.5)

## 2014-02-07 LAB — RPR

## 2014-02-07 MED ORDER — ONDANSETRON HCL 4 MG/2ML IJ SOLN: 4.0000 mg | INTRAMUSCULAR | Status: DC | PRN

## 2014-02-07 MED ORDER — LACTATED RINGERS IV SOLN: 500.0000 mL | INTRAVENOUS | Status: DC | PRN

## 2014-02-07 MED ORDER — ONDANSETRON HCL 4 MG/2ML IJ SOLN: 4.0000 mg | Freq: Four times a day (QID) | INTRAMUSCULAR | Status: DC | PRN

## 2014-02-07 MED ORDER — TETANUS-DIPHTH-ACELL PERTUSSIS 5-2.5-18.5 LF-MCG/0.5 IM SUSP: 0.5000 mL | Freq: Once | INTRAMUSCULAR | Status: DC

## 2014-02-07 MED ORDER — LIDOCAINE HCL (PF) 1 % IJ SOLN
INTRAMUSCULAR | Status: DC | PRN
  Administered 2014-02-07 (×2): 4 mL

## 2014-02-07 MED ORDER — CITRIC ACID-SODIUM CITRATE 334-500 MG/5ML PO SOLN: 30.0000 mL | ORAL | Status: DC | PRN

## 2014-02-07 MED ORDER — PHENYLEPHRINE 40 MCG/ML (10ML) SYRINGE FOR IV PUSH (FOR BLOOD PRESSURE SUPPORT)
PREFILLED_SYRINGE | INTRAVENOUS | Status: AC
  Filled 2014-02-07: qty 10

## 2014-02-07 MED ORDER — FENTANYL 2.5 MCG/ML BUPIVACAINE 1/10 % EPIDURAL INFUSION (WH - ANES)
INTRAMUSCULAR | Status: AC
  Administered 2014-02-07: 14 mL/h via EPIDURAL
  Filled 2014-02-07: qty 125

## 2014-02-07 MED ORDER — EPHEDRINE 5 MG/ML INJ
10.0000 mg | INTRAVENOUS | Status: DC | PRN
  Filled 2014-02-07: qty 2

## 2014-02-07 MED ORDER — OXYTOCIN 40 UNITS IN LACTATED RINGERS INFUSION - SIMPLE MED
62.5000 mL/h | INTRAVENOUS | Status: DC
  Filled 2014-02-07: qty 1000

## 2014-02-07 MED ORDER — LIDOCAINE HCL (PF) 1 % IJ SOLN
30.0000 mL | INTRAMUSCULAR | Status: DC | PRN
  Filled 2014-02-07: qty 30

## 2014-02-07 MED ORDER — OXYCODONE-ACETAMINOPHEN 5-325 MG PO TABS
1.0000 | ORAL_TABLET | ORAL | Status: DC | PRN
  Administered 2014-02-07 – 2014-02-08 (×3): 1 via ORAL
  Filled 2014-02-07 (×3): qty 1

## 2014-02-07 MED ORDER — PHENYLEPHRINE 40 MCG/ML (10ML) SYRINGE FOR IV PUSH (FOR BLOOD PRESSURE SUPPORT)
80.0000 ug | PREFILLED_SYRINGE | INTRAVENOUS | Status: DC | PRN
  Filled 2014-02-07: qty 2

## 2014-02-07 MED ORDER — LACTATED RINGERS IV SOLN: 500.0000 mL | Freq: Once | INTRAVENOUS | Status: DC

## 2014-02-07 MED ORDER — ACETAMINOPHEN 325 MG PO TABS: 650.0000 mg | ORAL_TABLET | ORAL | Status: DC | PRN

## 2014-02-07 MED ORDER — FENTANYL 2.5 MCG/ML BUPIVACAINE 1/10 % EPIDURAL INFUSION (WH - ANES)
14.0000 mL/h | INTRAMUSCULAR | Status: DC | PRN
  Administered 2014-02-07: 14 mL/h via EPIDURAL

## 2014-02-07 MED ORDER — ZOLPIDEM TARTRATE 5 MG PO TABS: 5.0000 mg | ORAL_TABLET | Freq: Every evening | ORAL | Status: DC | PRN

## 2014-02-07 MED ORDER — SENNOSIDES-DOCUSATE SODIUM 8.6-50 MG PO TABS
2.0000 | ORAL_TABLET | ORAL | Status: DC
  Administered 2014-02-07: 2 via ORAL
  Filled 2014-02-07: qty 2

## 2014-02-07 MED ORDER — FENTANYL 2.5 MCG/ML BUPIVACAINE 1/10 % EPIDURAL INFUSION (WH - ANES)
INTRAMUSCULAR | Status: DC | PRN
  Administered 2014-02-07: 14 mL/h via EPIDURAL

## 2014-02-07 MED ORDER — OXYCODONE-ACETAMINOPHEN 5-325 MG PO TABS: 1.0000 | ORAL_TABLET | ORAL | Status: DC | PRN

## 2014-02-07 MED ORDER — DIBUCAINE 1 % RE OINT
1.0000 "application " | TOPICAL_OINTMENT | RECTAL | Status: DC | PRN
  Administered 2014-02-07: 1 via RECTAL
  Filled 2014-02-07: qty 28

## 2014-02-07 MED ORDER — OXYCODONE-ACETAMINOPHEN 5-325 MG PO TABS: 2.0000 | ORAL_TABLET | ORAL | Status: DC | PRN

## 2014-02-07 MED ORDER — DIPHENHYDRAMINE HCL 50 MG/ML IJ SOLN
12.5000 mg | INTRAMUSCULAR | Status: DC | PRN
  Administered 2014-02-07 (×2): 12.5 mg via INTRAVENOUS
  Filled 2014-02-07 (×2): qty 1

## 2014-02-07 MED ORDER — IBUPROFEN 600 MG PO TABS
600.0000 mg | ORAL_TABLET | Freq: Four times a day (QID) | ORAL | Status: DC
  Administered 2014-02-07 – 2014-02-08 (×5): 600 mg via ORAL
  Filled 2014-02-07 (×5): qty 1

## 2014-02-07 MED ORDER — LACTATED RINGERS IV SOLN: INTRAVENOUS | Status: DC

## 2014-02-07 MED ORDER — BENZOCAINE-MENTHOL 20-0.5 % EX AERO
1.0000 "application " | INHALATION_SPRAY | CUTANEOUS | Status: DC | PRN
  Administered 2014-02-07: 1 via TOPICAL
  Filled 2014-02-07: qty 56

## 2014-02-07 MED ORDER — ONDANSETRON HCL 4 MG PO TABS
4.0000 mg | ORAL_TABLET | ORAL | Status: DC | PRN
  Administered 2014-02-07: 4 mg via ORAL
  Filled 2014-02-07: qty 1

## 2014-02-07 MED ORDER — SIMETHICONE 80 MG PO CHEW: 80.0000 mg | CHEWABLE_TABLET | ORAL | Status: DC | PRN

## 2014-02-07 MED ORDER — MEASLES, MUMPS & RUBELLA VAC ~~LOC~~ INJ: 0.5000 mL | INJECTION | Freq: Once | SUBCUTANEOUS | Status: DC

## 2014-02-07 MED ORDER — OXYTOCIN BOLUS FROM INFUSION: 500.0000 mL | INTRAVENOUS | Status: DC

## 2014-02-07 MED ORDER — DIPHENHYDRAMINE HCL 25 MG PO CAPS
25.0000 mg | ORAL_CAPSULE | Freq: Four times a day (QID) | ORAL | Status: DC | PRN
  Administered 2014-02-07: 25 mg via ORAL
  Filled 2014-02-07: qty 1

## 2014-02-07 MED ORDER — WITCH HAZEL-GLYCERIN EX PADS
1.0000 "application " | MEDICATED_PAD | CUTANEOUS | Status: DC | PRN
  Administered 2014-02-07: 1 via TOPICAL

## 2014-02-07 MED ORDER — FLEET ENEMA 7-19 GM/118ML RE ENEM: 1.0000 | ENEMA | Freq: Every day | RECTAL | Status: DC | PRN

## 2014-02-07 NOTE — Anesthesia Preprocedure Evaluation (Signed)
Anesthesia Evaluation  Patient identified by MRN, date of birth, ID band Patient awake    Reviewed: Allergy & Precautions, H&P , Patient's Chart, lab work & pertinent test results  Airway Mallampati: II  TM Distance: >3 FB Neck ROM: Full    Dental no notable dental hx. (+) Teeth Intact   Pulmonary Current Smoker,  breath sounds clear to auscultation  Pulmonary exam normal       Cardiovascular negative cardio ROS  Rhythm:Regular Rate:Normal     Neuro/Psych PSYCHIATRIC DISORDERS Anxiety Depression negative neurological ROS     GI/Hepatic negative GI ROS, Neg liver ROS,   Endo/Other  negative endocrine ROS  Renal/GU negative Renal ROS  negative genitourinary   Musculoskeletal negative musculoskeletal ROS (+)   Abdominal   Peds  Hematology  (+) anemia ,   Anesthesia Other Findings   Reproductive/Obstetrics (+) Pregnancy                             Anesthesia Physical Anesthesia Plan  ASA: II  Anesthesia Plan: Epidural   Post-op Pain Management:    Induction:   Airway Management Planned: Natural Airway  Additional Equipment:   Intra-op Plan:   Post-operative Plan:   Informed Consent: I have reviewed the patients History and Physical, chart, labs and discussed the procedure including the risks, benefits and alternatives for the proposed anesthesia with the patient or authorized representative who has indicated his/her understanding and acceptance.     Plan Discussed with: Anesthesiologist  Anesthesia Plan Comments:         Anesthesia Quick Evaluation

## 2014-02-07 NOTE — MAU Note (Signed)
PT SAYS HURTING BAD  SINCE  [id number].   VE IN OFFICE - NO.  WAS IN  MAU  LAST NIGHT-   VE 2-3 CM.    DENIES HSV   AND MRSA. GBS-  NEG

## 2014-02-07 NOTE — Anesthesia Postprocedure Evaluation (Signed)
Anesthesia Post Note  Patient: Jeanne EhrichHolly D Lyons  Procedure(s) Performed: * No procedures listed *  Anesthesia type: Epidural  Patient location: Mother/Baby  Post pain: Pain level controlled  Post assessment: Post-op Vital signs reviewed  Last Vitals:  Filed Vitals:   02/07/14 1155  BP: 124/82  Pulse: 63  Temp: 36.9 C  Resp: 18    Post vital signs: Reviewed  Level of consciousness:alert  Complications: No apparent anesthesia complications

## 2014-02-07 NOTE — Lactation Note (Signed)
This note was copied from the [facility] of [facility]. Lactation Consultation Note  Patient Name: Girl Jeanne CokeHolly Cerutti ZOXWR'UToday's Date: 02/07/2014 Reason for consult: Initial assessment of this mom and baby, now 12 hours pp.  Mom is preparing to eat supper but denies any breastfeeding concerns.This is mom's third child.  She did not breastfeed her 527 yo but her 26 yo breastfed and received pumped breast milk for 4 months.  Mom reports that her newborn is latching well.  LC encouraged frequent STS and cue feedings.  Mom says she knows how to hand express her milk and RN documented teaching hand expression at 1045-1100 feeding.  LATCH score=9 at earlier feeding and baby has had two 10 minute feedings since birth. Mom encouraged to feed baby 8-12 times/24 hours and with feeding cues. LC encouraged review of Baby and Me pp 9, 14 and 20-25 for STS and BF information. LC provided [employer] Resource brochure and reviewed [facility] and list of community and web site resources.     Maternal Data Formula Feeding for Exclusion: No Has patient been taught Hand Expression?: Yes (per mom's report and RN documentation at 1100) Does the patient have breastfeeding experience prior to this delivery?: Yes  Feeding    LATCH Score/Interventions         LATCH score=9 at feeding earlier today, per RN assessment             Lactation Tools Discussed/Used   STS, cue feedings, hand expression  Consult Status Consult Status: Follow-up Date: 02/08/14 Follow-up type: In-patient    Warrick ParisianBryant, Elbia Paro [facility] 02/07/2014, 10:00 PM

## 2014-02-07 NOTE — MAU Note (Signed)
Pt states that she has been ctx for over 24 hours. Pt states that they are 5 min apart and 9/10 on pain scale. C/o pink discharge. +FM. Denies complications.

## 2014-02-07 NOTE — Anesthesia Procedure Notes (Signed)
Epidural Patient location during procedure: OB Start time: 02/07/2014 5:09 AM  Staffing Anesthesiologist: Anaclara Acklin A. Performed by: anesthesiologist   Preanesthetic Checklist Completed: patient identified, site marked, surgical consent, pre-op evaluation, timeout performed, IV checked, risks and benefits discussed and monitors and equipment checked  Epidural Patient position: sitting Prep: site prepped and draped and DuraPrep Patient monitoring: continuous pulse ox and blood pressure Approach: midline Location: L3-L4 Injection technique: LOR air  Needle:  Needle type: Tuohy  Needle gauge: 17 G Needle length: 9 cm and 9 Needle insertion depth: 5 cm cm Catheter type: closed end flexible Catheter size: 19 Gauge Catheter at skin depth: 10 cm Test dose: negative and Other  Assessment Events: blood not aspirated, injection not painful, no injection resistance, negative IV test and no paresthesia  Additional Notes Patient identified. Risks and benefits discussed including failed block, incomplete  Pain control, post dural puncture headache, nerve damage, paralysis, blood pressure Changes, nausea, vomiting, reactions to medications-both toxic and allergic and post Partum back pain. All questions were answered. Patient expressed understanding and wished to proceed. Sterile technique was used throughout procedure. Epidural site was Dressed with sterile barrier dressing. No paresthesias, signs of intravascular injection Or signs of intrathecal spread were encountered.  Patient was more comfortable after the epidural was dosed. Please see RN's note for documentation of vital signs and FHR which are stable.

## 2014-02-08 LAB — CBC
HCT: 27.6 % — ABNORMAL LOW (ref 36.0–46.0)
Hemoglobin: 8.5 g/dL — ABNORMAL LOW (ref 12.0–15.0)
MCH: 22 pg — ABNORMAL LOW (ref 26.0–34.0)
MCHC: 30.8 g/dL (ref 30.0–36.0)
MCV: 71.3 fL — ABNORMAL LOW (ref 78.0–100.0)
Platelets: 176 10*3/uL (ref 150–400)
RBC: 3.87 MIL/uL (ref 3.87–5.11)
RDW: 17.8 % — AB (ref 11.5–15.5)
WBC: 10.2 10*3/uL (ref 4.0–10.5)

## 2014-02-08 MED ORDER — RHO D IMMUNE GLOBULIN 1500 UNIT/2ML IJ SOSY
300.0000 ug | PREFILLED_SYRINGE | Freq: Once | INTRAMUSCULAR | Status: AC
  Administered 2014-02-08: 300 ug via INTRAMUSCULAR
  Filled 2014-02-08: qty 2

## 2014-02-08 NOTE — Lactation Note (Signed)
This note was copied from the chart [facility]. Lactation Consultation Note  Patient Name: Girl Inda CokeHolly Zender UJWJX'BToday's Date: 02/08/2014 Reason for consult: Follow-up assessment Follow to document Mom's use of comfort gels due to nipple tenderness. Given by RN. RN advised Mom declined need to see LC before d/c home.   Maternal Data    Feeding    LATCH Score/Interventions                      Lactation Tools Discussed/Used Tools: Comfort gels   Consult Status Consult Status: Complete Date: 02/08/14 Follow-up type: In-patient    Alfred LevinsGranger, Holdyn Poyser Ann 02/08/2014, 12:15 PM

## 2014-02-08 NOTE — Discharge Summary (Signed)
Obstetric Discharge Summary Reason for Admission: rupture of membranes Prenatal Procedures: ultrasound Intrapartum Procedures: spontaneous vaginal delivery Postpartum Procedures: none Complications-Operative and Postpartum: none HEMOGLOBIN  Date Value Ref Range Status  02/08/2014 8.5* 12.0 - 15.0 g/dL Final   HCT  Date Value Ref Range Status  02/08/2014 27.6* 36.0 - 46.0 % Final    Physical Exam:  General: alert and cooperative Lochia: appropriate Uterine Fundus: firm DVT Evaluation: No evidence of DVT seen on physical exam.  Discharge Diagnoses: Term Pregnancy-delivered  Discharge Information: Date: 02/08/2014 Activity: pelvic rest Diet: routine Medications: PNV and Ibuprofen Condition: stable Instructions: refer to practice specific booklet Discharge to: home Follow-up Information    Follow up with Levi AlandANDERSON,MARK E, MD In 4 weeks.   Specialty:  Obstetrics and Gynecology   Contact information:   [address] [place] [id number] [phone]       Newborn Data: Live born female  Birth Weight: 6 lb 9.3 oz (2985 g) APGAR: 9, 9  Home with mother.  Jeanne AspenCALLAHAN, Jeanne Bodnar 02/08/2014, 10:11 AM

## 2014-02-08 NOTE — Progress Notes (Signed)
UR chart review completed.  

## 2014-02-09 LAB — RH IG WORKUP (INCLUDES ABO/RH)
ABO/RH(D): A NEG
Fetal Screen: NEGATIVE
GESTATIONAL AGE(WKS): 39
Unit division: 0

## 2014-02-11 LAB — TYPE AND SCREEN
ABO/RH(D): A NEG
ANTIBODY SCREEN: POSITIVE
DAT, IGG: NEGATIVE
UNIT DIVISION: 0
Unit division: 0

## 2014-03-04 NOTE — H&P (Signed)
Pt is a 29 y/o white female, [id number] who is admitted for SROM. Her PNC was uncomplicated. She has a h.o. Smoking. PMHX: See Prenatal record PE: VSSAF        HEENT-wnl        Abd-gravid IMP/ IUP at term with SROM Plan/ Admit

## 2014-03-30 ENCOUNTER — Other Ambulatory Visit: Payer: Self-pay | Admitting: Obstetrics and Gynecology

## 2014-04-06 ENCOUNTER — Encounter (HOSPITAL_BASED_OUTPATIENT_CLINIC_OR_DEPARTMENT_OTHER): Payer: Self-pay | Admitting: *Deleted

## 2014-04-07 ENCOUNTER — Encounter (HOSPITAL_BASED_OUTPATIENT_CLINIC_OR_DEPARTMENT_OTHER): Payer: Self-pay | Admitting: *Deleted

## 2014-04-07 NOTE — Progress Notes (Signed)
NPO AFTER MN. ARRIVE AT 1015.  PT TO COME ON Tuesday 04-11-2014 FOR CBC AND URINE PREG.

## 2014-04-11 DIAGNOSIS — Z79899 Other long term (current) drug therapy: Secondary | ICD-10-CM | POA: Diagnosis not present

## 2014-04-11 DIAGNOSIS — Z302 Encounter for sterilization: Secondary | ICD-10-CM | POA: Diagnosis present

## 2014-04-11 DIAGNOSIS — F17210 Nicotine dependence, cigarettes, uncomplicated: Secondary | ICD-10-CM | POA: Diagnosis not present

## 2014-04-11 DIAGNOSIS — F419 Anxiety disorder, unspecified: Secondary | ICD-10-CM | POA: Diagnosis not present

## 2014-04-11 LAB — CBC
HCT: 35.8 % — ABNORMAL LOW (ref 36.0–46.0)
Hemoglobin: 10.8 g/dL — ABNORMAL LOW (ref 12.0–15.0)
MCH: 23.7 pg — AB (ref 26.0–34.0)
MCHC: 30.2 g/dL (ref 30.0–36.0)
MCV: 78.5 fL (ref 78.0–100.0)
PLATELETS: 202 10*3/uL (ref 150–400)
RBC: 4.56 MIL/uL (ref 3.87–5.11)
RDW: 18.8 % — AB (ref 11.5–15.5)
WBC: 4.6 10*3/uL (ref 4.0–10.5)

## 2014-04-13 ENCOUNTER — Ambulatory Visit (HOSPITAL_BASED_OUTPATIENT_CLINIC_OR_DEPARTMENT_OTHER): Payer: Medicaid Other | Admitting: Anesthesiology

## 2014-04-13 ENCOUNTER — Ambulatory Visit (HOSPITAL_BASED_OUTPATIENT_CLINIC_OR_DEPARTMENT_OTHER)
Admission: RE | Admit: 2014-04-13 | Discharge: 2014-04-13 | Disposition: A | Discharge status: Home / Self Care | Payer: Medicaid Other | Source: Ambulatory Visit | Attending: Obstetrics and Gynecology | Admitting: Obstetrics and Gynecology

## 2014-04-13 ENCOUNTER — Encounter (HOSPITAL_BASED_OUTPATIENT_CLINIC_OR_DEPARTMENT_OTHER): Payer: Self-pay

## 2014-04-13 ENCOUNTER — Encounter (HOSPITAL_BASED_OUTPATIENT_CLINIC_OR_DEPARTMENT_OTHER)
Admission: RE | Disposition: A | Discharge status: Home / Self Care | Payer: Self-pay | Source: Ambulatory Visit | Attending: Obstetrics and Gynecology

## 2014-04-13 DIAGNOSIS — Z79899 Other long term (current) drug therapy: Secondary | ICD-10-CM | POA: Diagnosis not present

## 2014-04-13 DIAGNOSIS — Z302 Encounter for sterilization: Secondary | ICD-10-CM | POA: Insufficient documentation

## 2014-04-13 DIAGNOSIS — F419 Anxiety disorder, unspecified: Secondary | ICD-10-CM | POA: Diagnosis not present

## 2014-04-13 DIAGNOSIS — F17210 Nicotine dependence, cigarettes, uncomplicated: Secondary | ICD-10-CM | POA: Diagnosis not present

## 2014-04-13 HISTORY — DX: Iron deficiency anemia, unspecified: D50.9

## 2014-04-13 HISTORY — PX: LAPAROSCOPIC TUBAL LIGATION: SHX1937

## 2014-04-13 HISTORY — DX: Personal history of other diseases of the female genital tract: Z87.42

## 2014-04-13 LAB — POCT HEMOGLOBIN-HEMACUE: Hemoglobin: 11.2 g/dL — ABNORMAL LOW (ref 12.0–15.0)

## 2014-04-13 LAB — POCT PREGNANCY, URINE: Preg Test, Ur: NEGATIVE

## 2014-04-13 SURGERY — LIGATION, FALLOPIAN TUBE, LAPAROSCOPIC
Anesthesia: General | Site: Abdomen | Laterality: Bilateral

## 2014-04-13 MED ORDER — LIDOCAINE HCL (CARDIAC) 20 MG/ML IV SOLN
INTRAVENOUS | Status: DC | PRN
  Administered 2014-04-13: 50 mg via INTRAVENOUS

## 2014-04-13 MED ORDER — MEPERIDINE HCL 25 MG/ML IJ SOLN
6.2500 mg | INTRAMUSCULAR | Status: DC | PRN
  Filled 2014-04-13: qty 1

## 2014-04-13 MED ORDER — PROPOFOL 10 MG/ML IV BOLUS
INTRAVENOUS | Status: DC | PRN
  Administered 2014-04-13: 200 mg via INTRAVENOUS

## 2014-04-13 MED ORDER — LACTATED RINGERS IV SOLN
INTRAVENOUS | Status: DC
  Administered 2014-04-13 (×2): via INTRAVENOUS
  Filled 2014-04-13: qty 1000

## 2014-04-13 MED ORDER — LACTATED RINGERS IV SOLN
INTRAVENOUS | Status: DC
  Filled 2014-04-13: qty 1000

## 2014-04-13 MED ORDER — FENTANYL CITRATE 0.05 MG/ML IJ SOLN
INTRAMUSCULAR | Status: AC
  Filled 2014-04-13: qty 6

## 2014-04-13 MED ORDER — ONDANSETRON HCL 4 MG/2ML IJ SOLN
INTRAMUSCULAR | Status: DC | PRN
  Administered 2014-04-13: 4 mg via INTRAVENOUS

## 2014-04-13 MED ORDER — MIDAZOLAM HCL 2 MG/2ML IJ SOLN
INTRAMUSCULAR | Status: AC
  Filled 2014-04-13: qty 2

## 2014-04-13 MED ORDER — NEOSTIGMINE METHYLSULFATE 10 MG/10ML IV SOLN
INTRAVENOUS | Status: DC | PRN
  Administered 2014-04-13: 3 mg via INTRAVENOUS

## 2014-04-13 MED ORDER — FENTANYL CITRATE 0.05 MG/ML IJ SOLN
INTRAMUSCULAR | Status: AC
  Filled 2014-04-13: qty 2

## 2014-04-13 MED ORDER — MEDROXYPROGESTERONE ACETATE 10 MG PO TABS: 10.0000 mg | ORAL_TABLET | Freq: Every day | ORAL | Status: DC

## 2014-04-13 MED ORDER — PROMETHAZINE HCL 25 MG/ML IJ SOLN
6.2500 mg | INTRAMUSCULAR | Status: DC | PRN
  Filled 2014-04-13: qty 1

## 2014-04-13 MED ORDER — OXYCODONE-ACETAMINOPHEN 5-325 MG PO TABS
ORAL_TABLET | ORAL | Status: AC
  Filled 2014-04-13: qty 1

## 2014-04-13 MED ORDER — OXYCODONE-ACETAMINOPHEN 10-325 MG PO TABS: 1.0000 | ORAL_TABLET | ORAL | Status: DC | PRN

## 2014-04-13 MED ORDER — CEFAZOLIN SODIUM-DEXTROSE 2-3 GM-% IV SOLR
2.0000 g | INTRAVENOUS | Status: AC
  Administered 2014-04-13: 2 g via INTRAVENOUS
  Filled 2014-04-13: qty 50

## 2014-04-13 MED ORDER — CEFAZOLIN SODIUM-DEXTROSE 2-3 GM-% IV SOLR
INTRAVENOUS | Status: AC
  Filled 2014-04-13: qty 50

## 2014-04-13 MED ORDER — SUCCINYLCHOLINE CHLORIDE 20 MG/ML IJ SOLN
INTRAMUSCULAR | Status: DC | PRN
  Administered 2014-04-13: 100 mg via INTRAVENOUS

## 2014-04-13 MED ORDER — GLYCOPYRROLATE 0.2 MG/ML IJ SOLN
INTRAMUSCULAR | Status: DC | PRN
  Administered 2014-04-13: 0.4 mg via INTRAVENOUS

## 2014-04-13 MED ORDER — ROCURONIUM BROMIDE 100 MG/10ML IV SOLN
INTRAVENOUS | Status: DC | PRN
  Administered 2014-04-13: 20 mg via INTRAVENOUS

## 2014-04-13 MED ORDER — OXYCODONE-ACETAMINOPHEN 5-325 MG PO TABS
1.0000 | ORAL_TABLET | ORAL | Status: DC | PRN
  Administered 2014-04-13: 1 via ORAL
  Filled 2014-04-13: qty 1

## 2014-04-13 MED ORDER — DEXAMETHASONE SODIUM PHOSPHATE 4 MG/ML IJ SOLN
INTRAMUSCULAR | Status: DC | PRN
  Administered 2014-04-13: 8 mg via INTRAVENOUS

## 2014-04-13 MED ORDER — FENTANYL CITRATE 0.05 MG/ML IJ SOLN
INTRAMUSCULAR | Status: DC | PRN
  Administered 2014-04-13: 25 ug via INTRAVENOUS
  Administered 2014-04-13: 50 ug via INTRAVENOUS
  Administered 2014-04-13: 25 ug via INTRAVENOUS

## 2014-04-13 MED ORDER — MIDAZOLAM HCL 5 MG/5ML IJ SOLN
INTRAMUSCULAR | Status: DC | PRN
  Administered 2014-04-13: 2 mg via INTRAVENOUS

## 2014-04-13 MED ORDER — FENTANYL CITRATE 0.05 MG/ML IJ SOLN
25.0000 ug | INTRAMUSCULAR | Status: DC | PRN
  Administered 2014-04-13 (×2): 50 ug via INTRAVENOUS
  Filled 2014-04-13: qty 1

## 2014-04-13 MED ORDER — BUPIVACAINE HCL 0.25 % IJ SOLN
INTRAMUSCULAR | Status: DC | PRN
  Administered 2014-04-13: 9 mL

## 2014-04-13 MED ORDER — SODIUM CHLORIDE 0.9 % IR SOLN
Status: DC | PRN
  Administered 2014-04-13: 500 mL

## 2014-04-13 MED ORDER — KETOROLAC TROMETHAMINE 30 MG/ML IJ SOLN
INTRAMUSCULAR | Status: DC | PRN
  Administered 2014-04-13: 30 mg via INTRAVENOUS

## 2014-04-13 SURGICAL SUPPLY — 42 items
APPLICATOR COTTON TIP 6IN STRL (MISCELLANEOUS) ×3 IMPLANT
BAG URINE DRAINAGE (UROLOGICAL SUPPLIES) IMPLANT
BANDAGE ADH SHEER 1  50/CT (GAUZE/BANDAGES/DRESSINGS) ×3 IMPLANT
BANDAGE ADHESIVE 1X3 (GAUZE/BANDAGES/DRESSINGS) IMPLANT
BLADE CLIPPER SURG (BLADE) IMPLANT
BLADE SURG 11 STRL SS (BLADE) ×3 IMPLANT
CANISTER SUCTION 1200CC (MISCELLANEOUS) IMPLANT
CANISTER SUCTION 2500CC (MISCELLANEOUS) IMPLANT
CATH ROBINSON RED A/P 16FR (CATHETERS) ×3 IMPLANT
CLOSURE WOUND 1/4X4 (GAUZE/BANDAGES/DRESSINGS)
COVER MAYO STAND STRL (DRAPES) ×3 IMPLANT
DRAPE CAMERA CLOSED 9X96 (DRAPES) IMPLANT
DRAPE UNDERBUTTOCKS STRL (DRAPE) ×3 IMPLANT
DRESSING TELFA ISLAND 4X8 (GAUZE/BANDAGES/DRESSINGS) IMPLANT
DRSG OPSITE POSTOP 3X4 (GAUZE/BANDAGES/DRESSINGS) IMPLANT
ELECT REM PT RETURN 9FT ADLT (ELECTROSURGICAL) ×3
ELECTRODE REM PT RTRN 9FT ADLT (ELECTROSURGICAL) ×1 IMPLANT
GLOVE ECLIPSE 7.0 STRL STRAW (GLOVE) ×3 IMPLANT
GOWN STRL REUS W/TWL LRG LVL3 (GOWN DISPOSABLE) ×6 IMPLANT
LIQUID BAND (GAUZE/BANDAGES/DRESSINGS) ×3 IMPLANT
NEEDLE HYPO 25X1 1.5 SAFETY (NEEDLE) IMPLANT
NEEDLE INSUFFLATION 14GA 120MM (NEEDLE) IMPLANT
NEEDLE INSUFFLATION 14GA 150MM (NEEDLE) IMPLANT
NS IRRIG 500ML POUR BTL (IV SOLUTION) ×3 IMPLANT
PACK BASIN DAY SURGERY FS (CUSTOM PROCEDURE TRAY) ×3 IMPLANT
PACK LAPAROSCOPY II (CUSTOM PROCEDURE TRAY) ×3 IMPLANT
PAD OB MATERNITY 4.3X12.25 (PERSONAL CARE ITEMS) ×3 IMPLANT
PAD PREP 24X48 CUFFED NSTRL (MISCELLANEOUS) ×3 IMPLANT
SET IRRIG TUBING LAPAROSCOPIC (IRRIGATION / IRRIGATOR) IMPLANT
SOLUTION ANTI FOG 6CC (MISCELLANEOUS) ×3 IMPLANT
STRIP CLOSURE SKIN 1/4X4 (GAUZE/BANDAGES/DRESSINGS) IMPLANT
SUT VICRYL 0 UR6 27IN ABS (SUTURE) ×3 IMPLANT
SUT VICRYL RAPIDE 3 0 (SUTURE) IMPLANT
SUT VICRYL RAPIDE 4/0 PS 2 (SUTURE) ×3 IMPLANT
SYR CONTROL 10ML LL (SYRINGE) ×3 IMPLANT
SYRINGE 10CC LL (SYRINGE) IMPLANT
TOWEL OR 17X24 6PK STRL BLUE (TOWEL DISPOSABLE) ×6 IMPLANT
TRAY DSU PREP LF (CUSTOM PROCEDURE TRAY) ×3 IMPLANT
TROCAR BALLN 12MMX100 BLUNT (TROCAR) ×3 IMPLANT
TROCAR XCEL NON-BLD 5MMX100MML (ENDOMECHANICALS) IMPLANT
TUBING INSUFFLATION W/FILTER (TUBING) ×3 IMPLANT
WATER STERILE IRR 500ML POUR (IV SOLUTION) IMPLANT

## 2014-04-13 NOTE — H&P (Signed)
Jeanne Lyons is an 29 y.o. [phone] who presents to the OR for a laproscopic BTL. She had a SVD 3 months ago. She has not had period since that time. She is not breast feeding. She had a negative preg test today. She understands the procedure/failure rate/complications.  Chief Complaint: HPI:  Past Medical History  Diagnosis Date  . Depression   . Anxiety   . History of abnormal cervical Pap smear   . Iron deficiency anemia     Past Surgical History  Procedure Laterality Date  . Cervical biopsy  w/ loop electrode excision  aug 2014    Family History  Problem Relation Age of Onset  . Other Neg Hx   . Asthma Father   . Diabetes Father   . Hypertension Father   . Anemia Mother     B12 deficiency  . Mental retardation Brother   . Down syndrome Cousin   . Down syndrome Cousin    Social History:  reports that she has been smoking Cigarettes.  She has a 4 pack-year smoking history. She has never used smokeless tobacco. She reports that she does not drink alcohol or use illicit drugs.  Allergies:  Allergies  Allergen Reactions  . Amoxicillin Rash  . Penicillins Rash    Medications Prior to Admission  Medication Sig Dispense Refill  . busPIRone (BUSPAR) 10 MG tablet Take 10 mg by mouth 2 (two) times daily.         Blood pressure 120/88, pulse 92, temperature 98.6 F (37 C), temperature source Oral, resp. rate 16, height 5\' 4"  (1.626 m), weight 134 lb (60.782 kg), SpO2 100 %, not currently breastfeeding. General appearance: alert and cooperative Lungs: clear to auscultation bilaterally Abdomen: soft, non-tender; bowel sounds normal; no masses,  no organomegaly   Lab Results  Component Value Date   WBC 4.6 04/11/2014   HGB 11.2* 04/13/2014   HCT 35.8* 04/11/2014   MCV 78.5 04/11/2014   PLT 202 04/11/2014   Lab Results  Component Value Date   PREGTESTUR NEGATIVE 04/13/2014     IMP/ Desires BTL Plan-Proceed with lap BTL

## 2014-04-13 NOTE — Anesthesia Postprocedure Evaluation (Signed)
  Anesthesia Post-op Note  Patient: Jeanne Lyons  Procedure(s) Performed: Procedure(s) (LRB): LAPAROSCOPIC TUBAL LIGATION (Bilateral)  Patient Location: PACU  Anesthesia Type: General  Level of Consciousness: awake and alert   Airway and Oxygen Therapy: Patient Spontanous Breathing  Post-op Pain: mild  Post-op Assessment: Post-op Vital signs reviewed, Patient's Cardiovascular Status Stable, Respiratory Function Stable, Patent Airway and No signs of Nausea or vomiting  Last Vitals:  Filed Vitals:   04/13/14 1300  BP: 130/89  Pulse: 59  Temp:   Resp: 17    Post-op Vital Signs: stable   Complications: No apparent anesthesia complications

## 2014-04-13 NOTE — Transfer of Care (Signed)
Immediate Anesthesia Transfer of Care Note  Patient: Jeanne EhrichHolly D Brazill  Procedure(s) Performed: Procedure(s) (LRB): LAPAROSCOPIC TUBAL LIGATION (Bilateral)  Patient Location: PACU  Anesthesia Type: General  Level of Consciousness: awake, oriented, sedated and patient cooperative  Airway & Oxygen Therapy: Patient Spontanous Breathing and Patient connected to face mask oxygen  Post-op Assessment: Report given to PACU RN and Post -op Vital signs reviewed and stable  Post vital signs: Reviewed and stable  Complications: No apparent anesthesia complications

## 2014-04-13 NOTE — Discharge Instructions (Signed)
Post Anesthesia Home Care Instructions  Activity: Get plenty of rest for the remainder of the day. A responsible adult should stay with you for 24 hours following the procedure.  For the next 24 hours, DO NOT: -Drive a car -Advertising copywriterperate machinery -Drink alcoholic beverages -Take any medication unless instructed by your physician -Make any legal decisions or sign important papers.  Meals: Start with liquid foods such as gelatin or soup. Progress to regular foods as tolerated. Avoid greasy, spicy, heavy foods. If nausea and/or vomiting occur, drink only clear liquids until the nausea and/or vomiting subsides. Call your physician if vomiting continues.  Special Instructions/Symptoms: Your throat may feel dry or sore from the anesthesia or the breathing tube placed in your throat during surgery. If this causes discomfort, gargle with warm salt water. The discomfort should disappear within 24 hours. Laparoscopic Tubal Ligation Laparoscopic tubal ligation is a procedure that closes the fallopian tubes at a time other than right after childbirth. By closing the fallopian tubes, the eggs that are released from the ovaries cannot enter the uterus and sperm cannot reach the egg. Tubal ligation is also known as getting your "tubes tied." Tubal ligation is done so you will not be able to get pregnant or have a baby.  Although this procedure may be reversed, it should be considered permanent and irreversible. If you want to have future pregnancies, you should not have this procedure.  LET YOUR CAREGIVER KNOW ABOUT:  Allergies to food or medicine.  Medicines taken, including vitamins, herbs, eyedrops, over-the-counter medicines, and creams.  Use of steroids (by mouth or creams).  Previous problems with numbing medicines.  History of bleeding problems or blood clots.  Any recent colds or infections.  Previous surgery.  Other health problems, including diabetes and kidney problems.  Possibility of  pregnancy, if this applies.  Any past pregnancies. RISKS AND COMPLICATIONS   Infection.  Bleeding.  Injury to surrounding organs.  Anesthetic side effects.  Failure of the procedure.  Ectopic pregnancy.  Future regret about having the procedure done. BEFORE THE PROCEDURE  Do not take aspirin or blood thinners a week before the procedure or as directed. This can cause bleeding.  Do not eat or drink anything 6 to 8 hours before the procedure. PROCEDURE   You may be given a medicine to help you relax (sedative) before the procedure. You will be given a medicine to make you sleep (general anesthetic) during the procedure.  A tube will be put down your throat to help your breath while under general anesthesia.  Two small cuts (incisions) are made in the lower abdominal area and near the belly button.  Your abdominal area will be inflated with a safe gas (carbon dioxide). This helps give the surgeon room to operate, visualize, and helps the surgeon avoid other organs.  A thin, lighted tube (laparoscope) with a camera attached is inserted into your abdomen through one of the incisions near the belly button. Other small instruments are also inserted through the other abdominal incision.  The fallopian tubes are located and are either blocked with a ring, clip, or are burned (cauterized).  After the fallopian tubes are blocked, the gas is released from the abdomen.  The incisions will be closed with stitches (sutures), and a bandage may be placed over the incisions. AFTER THE PROCEDURE   You will rest in a recovery room for 1--4 hours until you are stable and doing well.  You will also have some mild abdominal discomfort for  3--7 days. You will be given pain medicine to ease any discomfort.  As long as there are no problems, you may be allowed to go home. Someone will need to drive you home and be with you for at least 24 hours once home.  You may have some mild discomfort in  the throat. This is from the tube placed in your throat while you were sleeping.  You may experience discomfort in the shoulder area from some trapped air between the liver and diaphragm. This sensation is normal and will slowly go away on its own. Document Released: 05/19/2000 Document Revised: 08/12/2011 Document Reviewed: 05/24/2011 [facility] Patient Information 2015 [place], [place]. This information is not intended to replace advice given to you by your health care provider. Make sure you discuss any questions you have with your health care provider.

## 2014-04-13 NOTE — Anesthesia Preprocedure Evaluation (Addendum)
Anesthesia Evaluation  Patient identified by MRN, date of birth, ID band Patient awake    Reviewed: Allergy & Precautions, NPO status , Patient's Chart, lab work & pertinent test results  Airway Mallampati: II  TM Distance: >3 FB Neck ROM: Full    Dental no notable dental hx.    Pulmonary Current Smoker,  breath sounds clear to auscultation  Pulmonary exam normal       Cardiovascular negative cardio ROS  Rhythm:Regular Rate:Normal     Neuro/Psych negative neurological ROS  negative psych ROS   GI/Hepatic negative GI ROS, Neg liver ROS,   Endo/Other  negative endocrine ROS  Renal/GU negative Renal ROS  negative genitourinary   Musculoskeletal negative musculoskeletal ROS (+)   Abdominal   Peds negative pediatric ROS (+)  Hematology negative hematology ROS (+)   Anesthesia Other Findings   Reproductive/Obstetrics negative OB ROS                           Anesthesia Physical Anesthesia Plan  ASA: II  Anesthesia Plan: General   Post-op Pain Management:    Induction: Intravenous  Airway Management Planned: Oral ETT  Additional Equipment:   Intra-op Plan:   Post-operative Plan: Extubation in OR  Informed Consent: I have reviewed the patients History and Physical, chart, labs and discussed the procedure including the risks, benefits and alternatives for the proposed anesthesia with the patient or authorized representative who has indicated his/her understanding and acceptance.   Dental advisory given  Plan Discussed with: CRNA  Anesthesia Plan Comments:         Anesthesia Quick Evaluation

## 2014-04-13 NOTE — Anesthesia Procedure Notes (Signed)
Procedure Name: Intubation Date/Time: 04/13/2014 11:54 AM Performed by: HAZEL, LISA D Pre-anesthesia Checklist: Patient identified, Emergency Drugs available, Suction available and Patient being monitored Patient Re-evaluated:Patient Re-evaluated prior to inductionOxygen Delivery Method: Circle System Utilized Preoxygenation: Pre-oxygenation with 100% oxygen Intubation Type: IV induction Ventilation: Mask ventilation without difficulty Laryngoscope Size: Mac and 3 Grade View: Grade I Tube type: Oral Tube size: 7.0 mm Number of attempts: 1 Airway Equipment and Method: Stylet,  Oral airway and LTA kit utilized Placement Confirmation: ETT inserted through vocal cords under direct vision,  positive ETCO2 and breath sounds checked- equal and bilateral Secured at: 20 cm Tube secured with: Tape Dental Injury: Teeth and Oropharynx as per pre-operative assessment      

## 2014-04-14 ENCOUNTER — Encounter (HOSPITAL_BASED_OUTPATIENT_CLINIC_OR_DEPARTMENT_OTHER): Payer: Self-pay | Admitting: Obstetrics and Gynecology

## 2014-04-14 NOTE — Op Note (Signed)
NAMVela Lyons:  Sesler, Dimonique               ACCOUNT NO.:  [id number]  MEDICAL RECORD NO.:  [id number]  LOCATION:                                 FACILITY:  PHYSICIAN:  Malva LimesMark Ashland Wiseman, M.D.    DATE OF BIRTH:  Apr 19, 1985  DATE OF PROCEDURE:  04/13/2014 DATE OF DISCHARGE:  04/13/2014                              OPERATIVE REPORT   PREOPERATIVE DIAGNOSIS:  Patient desires permanent sterilization.  POSTOPERATIVE DIAGNOSIS:  Patient desires permanent sterilization.  PROCEDURE:  Bilateral tubal ligation with Filshie clips.  SURGEON:  Malva LimesMark Kolten Ryback, M.D.  ANESTHESIA:  Local and general.  ANTIBIOTICS:  Ancef 2 g.  DRAINS:  Red rubber catheter to bladder.  SPECIMENS:  None.  FINDINGS:  The patient had normal fallopian tubes, ovaries, and uterus. She had no evidence of any pelvic adhesions or endometriosis.  PROCEDURE IN DETAIL:  The patient was taken to the operating room, where general anesthetic was administered without difficulty.  She was then prepped in usual fashion for this procedure.  A Hulka tenaculum was applied to the anterior cervical lip.  Her bladder was drained with a red rubber catheter.  She was then placed in dorsal lithotomy position. Her umbilicus was then injected with 0.25% Marcaine.  A vertical skin incision was made.  The fascia was grasped with Kochers and entered with a Mayo scissors.  The parietal peritoneum was entered with blunt dissection.  A Hasson cannula was placed in the abdominal cavity and 3 L of carbon dioxide was insufflated.  The patient was then placed in Trendelenburg.  Examination of the pelvic contents was as noted above. At this point, a Filshie clip was placed on the right fallopian tube in the isthmic portion of the tube.  The clip was placed perpendicular to the tube.  The entire tube appeared to be within the clasp.  The clasp appeared to be tightly closed.  A similar procedure was performed on the opposite side.  Following this, the  pneumoperitoneum and instruments removed.  The fascia was closed with interrupted 0 Vicryl suture in the skin with Dermabond.  The patient was taken to recovery room in stable condition.  Instrument and lap counts correct x2.  The patient was discharged to home with Percocet and Provera to take to begin her period.          ______________________________ Malva LimesMark Nandan Willems, M.D.     MA/MEDQ  D:  04/13/2014  T:  04/14/2014  Job:  [id number]

## 2016-03-25 ENCOUNTER — Other Ambulatory Visit: Payer: Self-pay | Admitting: Obstetrics and Gynecology

## 2016-04-09 ENCOUNTER — Encounter (HOSPITAL_COMMUNITY): Payer: Self-pay

## 2016-04-15 ENCOUNTER — Encounter (HOSPITAL_COMMUNITY)
Admission: RE | Disposition: A | Discharge status: Home / Self Care | Payer: Self-pay | Source: Ambulatory Visit | Attending: Obstetrics and Gynecology

## 2016-04-15 ENCOUNTER — Encounter (HOSPITAL_COMMUNITY): Payer: Self-pay

## 2016-04-15 ENCOUNTER — Ambulatory Visit (HOSPITAL_COMMUNITY)
Admission: RE | Admit: 2016-04-15 | Discharge: 2016-04-15 | Disposition: A | Discharge status: Home / Self Care | Payer: Medicaid Other | Source: Ambulatory Visit | Attending: Obstetrics and Gynecology | Admitting: Obstetrics and Gynecology

## 2016-04-15 ENCOUNTER — Ambulatory Visit (HOSPITAL_COMMUNITY): Payer: Medicaid Other | Admitting: Anesthesiology

## 2016-04-15 DIAGNOSIS — Z880 Allergy status to penicillin: Secondary | ICD-10-CM | POA: Insufficient documentation

## 2016-04-15 DIAGNOSIS — F329 Major depressive disorder, single episode, unspecified: Secondary | ICD-10-CM | POA: Insufficient documentation

## 2016-04-15 DIAGNOSIS — I10 Essential (primary) hypertension: Secondary | ICD-10-CM | POA: Diagnosis not present

## 2016-04-15 DIAGNOSIS — N946 Dysmenorrhea, unspecified: Secondary | ICD-10-CM | POA: Diagnosis present

## 2016-04-15 DIAGNOSIS — F17210 Nicotine dependence, cigarettes, uncomplicated: Secondary | ICD-10-CM | POA: Insufficient documentation

## 2016-04-15 DIAGNOSIS — F419 Anxiety disorder, unspecified: Secondary | ICD-10-CM | POA: Diagnosis not present

## 2016-04-15 DIAGNOSIS — N920 Excessive and frequent menstruation with regular cycle: Secondary | ICD-10-CM | POA: Insufficient documentation

## 2016-04-15 HISTORY — PX: DILITATION & CURRETTAGE/HYSTROSCOPY WITH NOVASURE ABLATION: SHX5568

## 2016-04-15 HISTORY — DX: Essential (primary) hypertension: I10

## 2016-04-15 HISTORY — DX: Unspecified blood type, rh negative: Z67.91

## 2016-04-15 LAB — CBC
HEMATOCRIT: 39 % (ref 36.0–46.0)
HEMOGLOBIN: 13 g/dL (ref 12.0–15.0)
MCH: 27 pg (ref 26.0–34.0)
MCHC: 33.3 g/dL (ref 30.0–36.0)
MCV: 80.9 fL (ref 78.0–100.0)
Platelets: 208 10*3/uL (ref 150–400)
RBC: 4.82 MIL/uL (ref 3.87–5.11)
RDW: 14.3 % (ref 11.5–15.5)
WBC: 5.4 10*3/uL (ref 4.0–10.5)

## 2016-04-15 LAB — PREGNANCY, URINE: Preg Test, Ur: NEGATIVE

## 2016-04-15 SURGERY — DILATATION & CURETTAGE/HYSTEROSCOPY WITH NOVASURE ABLATION
Anesthesia: General | Site: Vagina

## 2016-04-15 MED ORDER — FLUMAZENIL 0.5 MG/5ML IV SOLN
INTRAVENOUS | Status: DC | PRN
  Administered 2016-04-15: .1 mg via INTRAVENOUS
  Administered 2016-04-15: 0.2 mg via INTRAVENOUS
  Administered 2016-04-15: .1 mg via INTRAVENOUS

## 2016-04-15 MED ORDER — OXYCODONE-ACETAMINOPHEN 2.5-325 MG PO TABS: 1.0000 | ORAL_TABLET | ORAL | 0 refills | Status: DC | PRN

## 2016-04-15 MED ORDER — PROPOFOL 10 MG/ML IV BOLUS
INTRAVENOUS | Status: DC | PRN
  Administered 2016-04-15: 200 mg via INTRAVENOUS

## 2016-04-15 MED ORDER — CLINDAMYCIN PHOSPHATE 900 MG/50ML IV SOLN
900.0000 mg | Freq: Once | INTRAVENOUS | Status: AC
  Administered 2016-04-15: 900 mg via INTRAVENOUS
  Filled 2016-04-15: qty 50

## 2016-04-15 MED ORDER — FENTANYL CITRATE (PF) 100 MCG/2ML IJ SOLN
INTRAMUSCULAR | Status: AC
  Filled 2016-04-15: qty 2

## 2016-04-15 MED ORDER — HYDROMORPHONE HCL 1 MG/ML IJ SOLN
0.2500 mg | INTRAMUSCULAR | Status: DC | PRN
  Administered 2016-04-15 (×2): 0.5 mg via INTRAVENOUS

## 2016-04-15 MED ORDER — LIDOCAINE HCL 1 % IJ SOLN
INTRAMUSCULAR | Status: AC
  Filled 2016-04-15: qty 20

## 2016-04-15 MED ORDER — ONDANSETRON HCL 4 MG/2ML IJ SOLN
INTRAMUSCULAR | Status: AC
  Filled 2016-04-15: qty 2

## 2016-04-15 MED ORDER — DEXAMETHASONE SODIUM PHOSPHATE 10 MG/ML IJ SOLN
INTRAMUSCULAR | Status: AC
  Filled 2016-04-15: qty 1

## 2016-04-15 MED ORDER — ONDANSETRON HCL 4 MG/2ML IJ SOLN
INTRAMUSCULAR | Status: DC | PRN
  Administered 2016-04-15: 4 mg via INTRAVENOUS

## 2016-04-15 MED ORDER — PROPOFOL 10 MG/ML IV BOLUS
INTRAVENOUS | Status: AC
  Filled 2016-04-15: qty 40

## 2016-04-15 MED ORDER — SCOPOLAMINE 1 MG/3DAYS TD PT72
MEDICATED_PATCH | TRANSDERMAL | Status: AC
  Administered 2016-04-15: 1.5 mg via TRANSDERMAL
  Filled 2016-04-15: qty 1

## 2016-04-15 MED ORDER — SODIUM CHLORIDE 0.9 % IR SOLN
Status: DC | PRN
  Administered 2016-04-15: 3000 mL

## 2016-04-15 MED ORDER — MIDAZOLAM HCL 2 MG/2ML IJ SOLN
INTRAMUSCULAR | Status: AC
  Filled 2016-04-15: qty 2

## 2016-04-15 MED ORDER — LACTATED RINGERS IV SOLN
INTRAVENOUS | Status: DC
  Administered 2016-04-15 (×2): via INTRAVENOUS

## 2016-04-15 MED ORDER — LIDOCAINE HCL (CARDIAC) 20 MG/ML IV SOLN
INTRAVENOUS | Status: AC
  Filled 2016-04-15: qty 5

## 2016-04-15 MED ORDER — LIDOCAINE HCL 1 % IJ SOLN
INTRAMUSCULAR | Status: DC | PRN
  Administered 2016-04-15: 10 mL

## 2016-04-15 MED ORDER — PROMETHAZINE HCL 25 MG/ML IJ SOLN: 6.2500 mg | INTRAMUSCULAR | Status: DC | PRN

## 2016-04-15 MED ORDER — MIDAZOLAM HCL 2 MG/2ML IJ SOLN
INTRAMUSCULAR | Status: DC | PRN
  Administered 2016-04-15: 2 mg via INTRAVENOUS

## 2016-04-15 MED ORDER — DEXAMETHASONE SODIUM PHOSPHATE 4 MG/ML IJ SOLN
INTRAMUSCULAR | Status: DC | PRN
  Administered 2016-04-15: 10 mg via INTRAVENOUS

## 2016-04-15 MED ORDER — GLYCOPYRROLATE 0.2 MG/ML IJ SOLN
INTRAMUSCULAR | Status: AC
  Filled 2016-04-15: qty 1

## 2016-04-15 MED ORDER — KETOROLAC TROMETHAMINE 30 MG/ML IJ SOLN
INTRAMUSCULAR | Status: DC | PRN
  Administered 2016-04-15: 30 mg via INTRAVENOUS

## 2016-04-15 MED ORDER — KETOROLAC TROMETHAMINE 30 MG/ML IJ SOLN
INTRAMUSCULAR | Status: AC
  Filled 2016-04-15: qty 1

## 2016-04-15 MED ORDER — LIDOCAINE HCL (CARDIAC) 20 MG/ML IV SOLN
INTRAVENOUS | Status: DC | PRN
  Administered 2016-04-15: 80 mg via INTRAVENOUS

## 2016-04-15 MED ORDER — GLYCOPYRROLATE 0.2 MG/ML IJ SOLN
INTRAMUSCULAR | Status: DC | PRN
  Administered 2016-04-15: 0.1 mg via INTRAVENOUS

## 2016-04-15 MED ORDER — FENTANYL CITRATE (PF) 250 MCG/5ML IJ SOLN
INTRAMUSCULAR | Status: DC | PRN
  Administered 2016-04-15 (×2): 100 ug via INTRAVENOUS

## 2016-04-15 MED ORDER — HYDROMORPHONE HCL 1 MG/ML IJ SOLN
INTRAMUSCULAR | Status: AC
  Administered 2016-04-15: 0.5 mg via INTRAVENOUS
  Filled 2016-04-15: qty 1

## 2016-04-15 MED ORDER — SCOPOLAMINE 1 MG/3DAYS TD PT72
1.0000 | MEDICATED_PATCH | Freq: Once | TRANSDERMAL | Status: DC
  Administered 2016-04-15: 1.5 mg via TRANSDERMAL

## 2016-04-15 MED ORDER — GENTAMICIN SULFATE 40 MG/ML IJ SOLN: INTRAVENOUS | Status: DC

## 2016-04-15 SURGICAL SUPPLY — 15 items
ABLATOR ENDOMETRIAL BIPOLAR (ABLATOR) ×3 IMPLANT
CANISTER SUCT 3000ML (MISCELLANEOUS) ×3 IMPLANT
CATH ROBINSON RED A/P 16FR (CATHETERS) ×3 IMPLANT
CLOTH BEACON ORANGE TIMEOUT ST (SAFETY) ×3 IMPLANT
CONTAINER PREFILL 10% NBF 60ML (FORM) IMPLANT
GLOVE BIOGEL PI IND STRL 7.0 (GLOVE) ×1 IMPLANT
GLOVE BIOGEL PI INDICATOR 7.0 (GLOVE) ×2
GLOVE ECLIPSE 7.0 STRL STRAW (GLOVE) ×6 IMPLANT
GOWN STRL REUS W/TWL LRG LVL3 (GOWN DISPOSABLE) ×6 IMPLANT
PACK VAGINAL MINOR WOMEN LF (CUSTOM PROCEDURE TRAY) ×3 IMPLANT
PAD OB MATERNITY 4.3X12.25 (PERSONAL CARE ITEMS) ×3 IMPLANT
TOWEL OR 17X24 6PK STRL BLUE (TOWEL DISPOSABLE) ×6 IMPLANT
TUBING AQUILEX INFLOW (TUBING) ×3 IMPLANT
TUBING AQUILEX OUTFLOW (TUBING) ×3 IMPLANT
WATER STERILE IRR 1000ML POUR (IV SOLUTION) ×3 IMPLANT

## 2016-04-15 NOTE — Anesthesia Procedure Notes (Signed)
Procedure Name: LMA Insertion Date/Time: 04/15/2016 12:11 PM Performed by: Graciela HusbandsFUSSELL, Haidar Muse O Pre-anesthesia Checklist: Patient identified, Patient being monitored, Emergency Drugs available, Timeout performed and Suction available Patient Re-evaluated:Patient Re-evaluated prior to inductionOxygen Delivery Method: Circle System Utilized Preoxygenation: Pre-oxygenation with 100% oxygen Intubation Type: IV induction Ventilation: Mask ventilation without difficulty LMA: LMA inserted LMA Size: 4.0 Number of attempts: 1 Placement Confirmation: positive ETCO2 and breath sounds checked- equal and bilateral Tube secured with: Tape Dental Injury: Teeth and Oropharynx as per pre-operative assessment

## 2016-04-15 NOTE — Transfer of Care (Signed)
Immediate Anesthesia Transfer of Care Note  Patient: Deedra EhrichHolly D Bir  Procedure(s) Performed: Procedure(s): DILATATION & CURETTAGE/HYSTEROSCOPY WITH NOVASURE ABLATION (N/A)  Patient Location: PACU  Anesthesia Type:General  Level of Consciousness: awake, alert  and oriented  Airway & Oxygen Therapy: Patient Spontanous Breathing and Patient connected to nasal cannula oxygen  Post-op Assessment: Report given to RN and Post -op Vital signs reviewed and stable  Post vital signs: Reviewed and stable  Last Vitals:  Vitals:   04/15/16 1133  BP: 105/77  Pulse: 71  Resp: 16  Temp: 36.8 C    Last Pain:  Vitals:   04/15/16 1133  TempSrc: Oral      Patients Stated Pain Goal: 3 (04/15/16 1133)  Complications: No apparent anesthesia complications

## 2016-04-15 NOTE — Discharge Instructions (Signed)

## 2016-04-15 NOTE — H&P (Signed)
Jeanne Lyons is an 31 y.o. [id number] white female who presents to the OR for a hysteroscopy D&C/ ablation for a h.o. Dysmenorrhea and heavy menses since BTL. She also has occ dysparunia. She has a nl u/s. Discussed other tx options to include IUD/ocps/ pt declined.  Chief Complaint: HPI:  Past Medical History:  Diagnosis Date  . Anxiety   . Blood type, Rh negative   . Depression   . History of abnormal cervical Pap smear   . Hypertension    with pregnancy  . Iron deficiency anemia     Past Surgical History:  Procedure Laterality Date  . CERVICAL BIOPSY  W/ LOOP ELECTRODE EXCISION  aug 2014  . LAPAROSCOPIC TUBAL LIGATION Bilateral 04/13/2014   Procedure: LAPAROSCOPIC TUBAL LIGATION;  Surgeon: Levi AlandMark E Jhanae Jaskowiak, MD;  Location: [facility] [place];  Service: Gynecology;  Laterality: Bilateral;    Family History  Problem Relation Age of Onset  . Asthma Father   . Diabetes Father   . Hypertension Father   . Anemia Mother     B12 deficiency  . Mental retardation Brother   . Down syndrome Cousin   . Down syndrome Cousin   . Other Neg Hx    Social History:  reports that she has been smoking Cigarettes.  She has a 5.00 pack-year smoking history. She has never used smokeless tobacco. She reports that she drinks alcohol. She reports that she does not use drugs.  Allergies:  Allergies  Allergen Reactions  . Amoxicillin Rash  . Penicillins Rash    Medications Prior to Admission  Medication Sig Dispense Refill  . lisdexamfetamine (VYVANSE) 20 MG capsule Take 20 mg by mouth daily.         Last menstrual period 04/14/2016. General appearance: alert and cooperative Lungs: clear to auscultation bilaterally Heart: regular rate and rhythm, S1, S2 normal, no murmur, click, rub or gallop Abdomen: soft, non-tender; bowel sounds normal; no masses,  no organomegaly   Lab Results  Component Value Date   WBC 4.6 04/11/2014   HGB 11.2 (L) 04/13/2014   HCT 35.8 (L) 04/11/2014   MCV 78.5 04/11/2014   PLT 202 04/11/2014   Lab Results  Component Value Date   PREGTESTUR NEGATIVE 04/13/2014      Patient Active Problem List   Diagnosis Date Noted  . Normal labor 02/07/2014  . Supervision of other normal pregnancy 02/07/2014  . ANEMIA, IRON DEFICIENCY 08/04/2007  . DEPRESSION 08/04/2007  . TOBACCO USE 07/21/2007  . FATIGUE 07/21/2007   IMP/ Dysmenorrhea          menorrhaghia          dysparunia Plan/ Hysteroscopy/ d&C/ Ablation  Jeanne Lyons E 04/15/2016, 11:21 AM

## 2016-04-15 NOTE — Anesthesia Preprocedure Evaluation (Addendum)
Anesthesia Evaluation  Patient identified by MRN, date of birth, ID band Patient awake    Reviewed: Allergy & Precautions, NPO status , Patient's Chart, lab work & pertinent test results  Airway Mallampati: II  TM Distance: >3 FB Neck ROM: Full    Dental  (+) Dental Advisory Given   Pulmonary Current Smoker,    Pulmonary exam normal        Cardiovascular hypertension, negative cardio ROS   Rhythm:Regular Rate:Normal     Neuro/Psych Anxiety Depression negative neurological ROS     GI/Hepatic negative GI ROS, Neg liver ROS,   Endo/Other  negative endocrine ROS  Renal/GU negative Renal ROS     Musculoskeletal   Abdominal   Peds  Hematology negative hematology ROS (+)   Anesthesia Other Findings   Reproductive/Obstetrics                           Lab Results  Component Value Date   WBC 4.6 04/11/2014   HGB 11.2 (L) 04/13/2014   HCT 35.8 (L) 04/11/2014   MCV 78.5 04/11/2014   PLT 202 04/11/2014    Anesthesia Physical Anesthesia Plan  ASA: I  Anesthesia Plan: General   Post-op Pain Management:    Induction: Intravenous  Airway Management Planned: LMA  Additional Equipment:   Intra-op Plan:   Post-operative Plan: Extubation in OR  Informed Consent: I have reviewed the patients History and Physical, chart, labs and discussed the procedure including the risks, benefits and alternatives for the proposed anesthesia with the patient or authorized representative who has indicated his/her understanding and acceptance.   Dental advisory given  Plan Discussed with: CRNA  Anesthesia Plan Comments:        Anesthesia Quick Evaluation

## 2016-04-16 NOTE — Op Note (Signed)
NAMVela Prose:  Jeanne Lyons, Jeanne Lyons               ACCOUNT NO.:  [id number]  MEDICAL RECORD NO.:  [id number]  LOCATION:                                 FACILITY:  PHYSICIAN:  Malva LimesMark Micheil Klaus, M.D.    DATE OF BIRTH:  10/12/1985  DATE OF PROCEDURE:  04/15/2016 DATE OF DISCHARGE:                              OPERATIVE REPORT   PREOPERATIVE DIAGNOSES: 1. Menorrhagia. 2. Dysmenorrhea.  POSTOPERATIVE DIAGNOSES: 1. Menorrhagia. 2. Dysmenorrhea.  OPERATION: 1. Hysteroscopy. 2. Dilation and curettage. 3. NovaSure endometrial ablation.  SURGEON:  Malva LimesMark Cornellius Kropp, M.D.  ANESTHESIA:  General and local.  ANTIBIOTICS:  Clindamycin 900 mg IV x1.  ESTIMATED BLOOD LOSS:  Minimal.  SPECIMENS:  Endometrial curettings sent to pathology.  COMPLICATIONS:  None.  DRAINS:  Red rubber catheter bladder.  PROCEDURE:  The patient was taken to the operating room, where she was placed in dorsal supine position.  A general anesthetic was administered without difficulty.  She was then placed in dorsal lithotomy position. She was prepped and draped in usual fashion for this procedure.  An exam under anesthesia revealed anteverted uterus of normal size and shape. There are no adnexal masses.  A sterile speculum placed in the vagina. 10 mL of 1% lidocaine was used for paracervical block.  The cervix was serially dilated to a 27-French.  The uterus was sounded to 9 cm.  The hysteroscope was advanced through the endocervical canal, which appeared to be normal.  On entering the uterine cavity, there was no evidence of any polyps or submucous fibroids.  At this point, the hysteroscope was removed.  Sharp curettage was performed.  The cervical length was then measured and was 3 cm giving a cavity length of 6 cm.  The NovaSure device was placed into the uterine cavity and opened, the width was 4.3 cm.  A seal test was performed and passed.  The device was then turned on for a total of 48 seconds giving 142 watts.  The  patient tolerated procedure well.  The device was removed.  The patient was awoken and taken to the recovery in stable condition.  Instrument and lap counts were correct x2.    ______________________________ Malva LimesMark Kellee Sittner, M.D.   ______________________________ Malva LimesMark Adelise Buswell, M.D.    MA/MEDQ  D:  04/15/2016  T:  04/15/2016  Job:  [id number]

## 2016-04-16 NOTE — Anesthesia Postprocedure Evaluation (Signed)
Anesthesia Post Note  Patient: Jeanne EhrichHolly D Lyons  Procedure(s) Performed: Procedure(s) (LRB): DILATATION & CURETTAGE/HYSTEROSCOPY WITH NOVASURE ABLATION (N/A)  Patient location during evaluation: PACU Anesthesia Type: General Level of consciousness: awake and alert Pain management: pain level controlled Vital Signs Assessment: post-procedure vital signs reviewed and stable Respiratory status: spontaneous breathing, nonlabored ventilation, respiratory function stable and patient connected to nasal cannula oxygen Cardiovascular status: blood pressure returned to baseline and stable Postop Assessment: no signs of nausea or vomiting Anesthetic complications: no       Last Vitals:  Vitals:   04/15/16 1430 04/15/16 1520  BP: 100/66 112/64  Pulse: (!) 54   Resp: 10 16  Temp:      Last Pain:  Vitals:   04/15/16 1520  TempSrc:   PainSc: 2                  Kennieth RadFitzgerald, Adem Costlow E

## 2016-04-17 ENCOUNTER — Encounter (HOSPITAL_COMMUNITY): Payer: Self-pay | Admitting: Obstetrics and Gynecology

## 2018-04-23 ENCOUNTER — Emergency Department (HOSPITAL_BASED_OUTPATIENT_CLINIC_OR_DEPARTMENT_OTHER): Payer: Medicaid Other

## 2018-04-23 ENCOUNTER — Other Ambulatory Visit: Payer: Self-pay

## 2018-04-23 ENCOUNTER — Emergency Department (HOSPITAL_BASED_OUTPATIENT_CLINIC_OR_DEPARTMENT_OTHER)
Admission: EM | Admit: 2018-04-23 | Discharge: 2018-04-23 | Disposition: A | Discharge status: Home / Self Care | Payer: Medicaid Other | Attending: Emergency Medicine | Admitting: Emergency Medicine

## 2018-04-23 ENCOUNTER — Encounter (HOSPITAL_BASED_OUTPATIENT_CLINIC_OR_DEPARTMENT_OTHER): Payer: Self-pay | Admitting: Emergency Medicine

## 2018-04-23 DIAGNOSIS — I10 Essential (primary) hypertension: Secondary | ICD-10-CM | POA: Insufficient documentation

## 2018-04-23 DIAGNOSIS — R1031 Right lower quadrant pain: Secondary | ICD-10-CM | POA: Diagnosis not present

## 2018-04-23 DIAGNOSIS — F329 Major depressive disorder, single episode, unspecified: Secondary | ICD-10-CM | POA: Insufficient documentation

## 2018-04-23 DIAGNOSIS — Z79899 Other long term (current) drug therapy: Secondary | ICD-10-CM | POA: Diagnosis not present

## 2018-04-23 DIAGNOSIS — F17210 Nicotine dependence, cigarettes, uncomplicated: Secondary | ICD-10-CM | POA: Insufficient documentation

## 2018-04-23 DIAGNOSIS — R1030 Lower abdominal pain, unspecified: Secondary | ICD-10-CM

## 2018-04-23 DIAGNOSIS — F419 Anxiety disorder, unspecified: Secondary | ICD-10-CM | POA: Insufficient documentation

## 2018-04-23 LAB — CBC
HCT: 44.4 % (ref 36.0–46.0)
Hemoglobin: 14.9 g/dL (ref 12.0–15.0)
MCH: 30.4 pg (ref 26.0–34.0)
MCHC: 33.6 g/dL (ref 30.0–36.0)
MCV: 90.6 fL (ref 80.0–100.0)
NRBC: 0 % (ref 0.0–0.2)
PLATELETS: 164 10*3/uL (ref 150–400)
RBC: 4.9 MIL/uL (ref 3.87–5.11)
RDW: 12.3 % (ref 11.5–15.5)
WBC: 6.8 10*3/uL (ref 4.0–10.5)

## 2018-04-23 LAB — COMPREHENSIVE METABOLIC PANEL
ALK PHOS: 34 U/L — AB (ref 38–126)
ALT: 12 U/L (ref 0–44)
AST: 18 U/L (ref 15–41)
Albumin: 4.3 g/dL (ref 3.5–5.0)
Anion gap: 5 (ref 5–15)
BUN: 9 mg/dL (ref 6–20)
CALCIUM: 8.8 mg/dL — AB (ref 8.9–10.3)
CO2: 24 mmol/L (ref 22–32)
CREATININE: 0.61 mg/dL (ref 0.44–1.00)
Chloride: 108 mmol/L (ref 98–111)
GFR calc non Af Amer: >60 mL/min (ref >60)
Glucose, Bld: 90 mg/dL (ref 70–99)
Potassium: 3.4 mmol/L — ABNORMAL LOW (ref 3.5–5.1)
SODIUM: 137 mmol/L (ref 135–145)
Total Bilirubin: 0.6 mg/dL (ref 0.3–1.2)
Total Protein: 6.7 g/dL (ref 6.5–8.1)

## 2018-04-23 LAB — URINALYSIS, ROUTINE W REFLEX MICROSCOPIC
BILIRUBIN URINE: NEGATIVE
Glucose, UA: NEGATIVE mg/dL
HGB URINE DIPSTICK: NEGATIVE
Ketones, ur: NEGATIVE mg/dL
Leukocytes,Ua: NEGATIVE
Nitrite: NEGATIVE
PH: 5.5 (ref 5.0–8.0)
Protein, ur: NEGATIVE mg/dL

## 2018-04-23 LAB — PREGNANCY, URINE: Preg Test, Ur: NEGATIVE

## 2018-04-23 LAB — LIPASE, BLOOD: Lipase: 31 U/L (ref 11–51)

## 2018-04-23 MED ORDER — SODIUM CHLORIDE 0.9% FLUSH
3.0000 mL | Freq: Once | INTRAVENOUS | Status: DC
  Filled 2018-04-23: qty 3

## 2018-04-23 MED ORDER — KETOROLAC TROMETHAMINE 30 MG/ML IJ SOLN
30.0000 mg | Freq: Once | INTRAMUSCULAR | Status: AC
  Administered 2018-04-23: 30 mg via INTRAVENOUS
  Filled 2018-04-23: qty 1

## 2018-04-23 MED ORDER — ONDANSETRON HCL 4 MG/2ML IJ SOLN
4.0000 mg | Freq: Once | INTRAMUSCULAR | Status: AC
  Administered 2018-04-23: 4 mg via INTRAVENOUS
  Filled 2018-04-23: qty 2

## 2018-04-23 NOTE — ED Triage Notes (Signed)
RLQ abd pain, sharp in nature since yesterday. States she gets this every month just prior to menstrual cycle.

## 2018-04-23 NOTE — ED Provider Notes (Signed)
[facility] EMERGENCY DEPARTMENT Provider Note   CSN: [id number] Arrival date & time: 04/23/18  0849    History   Chief Complaint Chief Complaint  Patient presents with  . Abdominal Pain    HPI Jeanne Lyons is a 33 y.o. female.     33 year old female with past medical history including anxiety/depression, gestational hypertension, anemia who presents with abdominal pain.  Patient states that for the past several months, she has been having right lower quadrant abdominal pain that comes on just before her menstruation and then resolves.  Yesterday evening she began having similar pain but pain has been more intense than usual.  She took Motrin overnight for her symptoms.  She denies any radiation of the pain.  She has had some associated nausea and vomiting.  No diarrhea, urinary symptoms, or vaginal discharge.  She has had some vaginal spotting which is normal for her when she is about to start her period.  No fevers or URI symptoms.  The history is provided by the patient.  Abdominal Pain    Past Medical History:  Diagnosis Date  . Anxiety   . Blood type, Rh negative   . Depression   . History of abnormal cervical Pap smear   . Hypertension    with pregnancy  . Iron deficiency anemia     Patient Active Problem List   Diagnosis Date Noted  . Normal labor 02/07/2014  . Supervision of other normal pregnancy 02/07/2014  . ANEMIA, IRON DEFICIENCY 08/04/2007  . DEPRESSION 08/04/2007  . TOBACCO USE 07/21/2007  . FATIGUE 07/21/2007    Past Surgical History:  Procedure Laterality Date  . CERVICAL BIOPSY  W/ LOOP ELECTRODE EXCISION  aug 2014  . DILITATION & CURRETTAGE/HYSTROSCOPY WITH NOVASURE ABLATION N/A 04/15/2016   Procedure: DILATATION & CURETTAGE/HYSTEROSCOPY WITH NOVASURE ABLATION;  Surgeon: Levi Aland, MD;  Location: [facility];  Service: Gynecology;  Laterality: N/A;  . LAPAROSCOPIC TUBAL LIGATION Bilateral 04/13/2014   Procedure: LAPAROSCOPIC TUBAL  LIGATION;  Surgeon: Levi Aland, MD;  Location: [facility];  Service: Gynecology;  Laterality: Bilateral;     OB History    Gravida  5   Para  3   Term  2   Preterm  1   AB  2   Living  3     SAB  2   TAB      Ectopic      Multiple  0   Live Births  3            Home Medications    Prior to Admission medications   Medication Sig Start Date End Date Taking? Authorizing Provider  lisdexamfetamine (VYVANSE) 20 MG capsule Take 20 mg by mouth daily.    [provider]  oxycodone-acetaminophen (PERCOCET) 2.5-325 MG tablet Take 1 tablet by mouth every 4 (four) hours as needed for pain. 04/15/16   Levi Aland, MD    Family History Family History  Problem Relation Age of Onset  . Asthma Father   . Diabetes Father   . Hypertension Father   . Anemia Mother        B12 deficiency  . Mental retardation Brother   . Down syndrome Cousin   . Down syndrome Cousin   . Other Neg Hx     Social History Social History   Tobacco Use  . Smoking status: Current Every Day Smoker    Packs/day: 0.50    Years: 10.00  Pack years: 5.00    Types: Cigarettes  . Smokeless tobacco: Never Used  Substance Use Topics  . Alcohol use: Yes    Comment: rare  . Drug use: No     Allergies   Amoxicillin and Penicillins   Review of Systems Review of Systems  Gastrointestinal: Positive for abdominal pain.   All other systems reviewed and are negative except that which was mentioned in HPI   Physical Exam Updated Vital Signs BP 110/77 (BP Location: Left Arm)   Pulse 74   Temp 97.8 F (36.6 C) (Oral)   Resp 16   Ht  (1.626 m)   Wt 54.4 kg   SpO2 100%   BMI 20.60 kg/m   Physical Exam Vitals signs and nursing note reviewed.  Constitutional:      General: She is not in acute distress.    Appearance: She is well-developed.  HENT:     Head: Normocephalic and atraumatic.     Mouth/Throat:     Mouth: Mucous membranes are moist.      Pharynx: Oropharynx is clear.  Eyes:     Conjunctiva/sclera: Conjunctivae normal.  Neck:     Musculoskeletal: Neck supple.  Cardiovascular:     Rate and Rhythm: Normal rate and regular rhythm.     Heart sounds: Normal heart sounds. No murmur.  Pulmonary:     Effort: Pulmonary effort is normal.     Breath sounds: Normal breath sounds.  Abdominal:     General: Bowel sounds are normal. There is no distension.     Palpations: Abdomen is soft.     Tenderness: There is abdominal tenderness.    Skin:    General: Skin is warm and dry.  Neurological:     Mental Status: She is alert and oriented to person, place, and time.     Comments: Fluent speech  Psychiatric:        Judgment: Judgment normal.      ED Treatments / Results  Labs (all labs ordered are listed, but only abnormal results are displayed) Labs Reviewed  URINALYSIS, ROUTINE W REFLEX MICROSCOPIC - Abnormal; Notable for the following components:      Result Value   Specific Gravity, Urine <1.005 (*)    All other components within normal limits  COMPREHENSIVE METABOLIC PANEL - Abnormal; Notable for the following components:   Potassium 3.4 (*)    Calcium 8.8 (*)    Alkaline Phosphatase 34 (*)    All other components within normal limits  PREGNANCY, URINE  LIPASE, BLOOD  CBC    EKG None  Radiology US Transvaginal Non-ob  Result Date: 04/23/2018 CLINICAL DATA:  Recurrent right lower quadrant abdominal pain prior demonstration. EXAM: TRANSABDOMINAL ULTRASOUND OF PELVIS DOPPLER ULTRASOUND OF OVARIES TECHNIQUE: Transabdominal ultrasound examination of the pelvis was performed including evaluation of the uterus, ovaries, adnexal regions, and pelvic cul-de-sac. Color and duplex Doppler ultrasound was utilized to evaluate blood flow to the ovaries. COMPARISON:  None. FINDINGS: Uterus Measurements: 9.2 x 6.0 x 5.4 cm = volume: 156 mL. No fibroids or other mass visualized. Endometrium Thickness: 12 mm which is within normal  limits. Possible 1 cm calcification seen within endometrial cavity which may represent sequela of prior endometrial ablation. Right ovary Measurements: 3.3 x 3.5 x 2.5 cm = volume: 15 mL. Normal appearance/no adnexal mass. Left ovary Measurements: 4.7 x 3.5 x 1.8 cm = volume: 16 mL. Normal appearance/no adnexal mass. Pulsed Doppler evaluation demonstrates normal low-resistance arterial and venous waveforms in both  ovaries. Other: Trace free fluid is noted which most likely is physiologic. IMPRESSION: Possible 1 cm calcification seen within endometrial cavity which may represent sequela of prior endometrial ablation. No other significant pelvic abnormality seen. No evidence of ovarian torsion or mass. Electronically Signed   By: Lupita Raider, M.D.   On: 04/23/2018 12:02   US Pelvis Complete  Result Date: 04/23/2018 CLINICAL DATA:  Recurrent right lower quadrant abdominal pain prior demonstration. EXAM: TRANSABDOMINAL ULTRASOUND OF PELVIS DOPPLER ULTRASOUND OF OVARIES TECHNIQUE: Transabdominal ultrasound examination of the pelvis was performed including evaluation of the uterus, ovaries, adnexal regions, and pelvic cul-de-sac. Color and duplex Doppler ultrasound was utilized to evaluate blood flow to the ovaries. COMPARISON:  None. FINDINGS: Uterus Measurements: 9.2 x 6.0 x 5.4 cm = volume: 156 mL. No fibroids or other mass visualized. Endometrium Thickness: 12 mm which is within normal limits. Possible 1 cm calcification seen within endometrial cavity which may represent sequela of prior endometrial ablation. Right ovary Measurements: 3.3 x 3.5 x 2.5 cm = volume: 15 mL. Normal appearance/no adnexal mass. Left ovary Measurements: 4.7 x 3.5 x 1.8 cm = volume: 16 mL. Normal appearance/no adnexal mass. Pulsed Doppler evaluation demonstrates normal low-resistance arterial and venous waveforms in both ovaries. Other: Trace free fluid is noted which most likely is physiologic. IMPRESSION: Possible 1 cm calcification  seen within endometrial cavity which may represent sequela of prior endometrial ablation. No other significant pelvic abnormality seen. No evidence of ovarian torsion or mass. Electronically Signed   By: Lupita Raider, M.D.   On: 04/23/2018 12:02   [place] Art/ven Flow Abd Pelv Doppler  Result Date: 04/23/2018 CLINICAL DATA:  Recurrent right lower quadrant abdominal pain prior demonstration. EXAM: TRANSABDOMINAL ULTRASOUND OF PELVIS DOPPLER ULTRASOUND OF OVARIES TECHNIQUE: Transabdominal ultrasound examination of the pelvis was performed including evaluation of the uterus, ovaries, adnexal regions, and pelvic cul-de-sac. Color and duplex Doppler ultrasound was utilized to evaluate blood flow to the ovaries. COMPARISON:  None. FINDINGS: Uterus Measurements: 9.2 x 6.0 x 5.4 cm = volume: 156 mL. No fibroids or other mass visualized. Endometrium Thickness: 12 mm which is within normal limits. Possible 1 cm calcification seen within endometrial cavity which may represent sequela of prior endometrial ablation. Right ovary Measurements: 3.3 x 3.5 x 2.5 cm = volume: 15 mL. Normal appearance/no adnexal mass. Left ovary Measurements: 4.7 x 3.5 x 1.8 cm = volume: 16 mL. Normal appearance/no adnexal mass. Pulsed Doppler evaluation demonstrates normal low-resistance arterial and venous waveforms in both ovaries. Other: Trace free fluid is noted which most likely is physiologic. IMPRESSION: Possible 1 cm calcification seen within endometrial cavity which may represent sequela of prior endometrial ablation. No other significant pelvic abnormality seen. No evidence of ovarian torsion or mass. Electronically Signed   By: Lupita Raider, M.D.   On: 04/23/2018 12:02    Procedures Procedures (including critical care time)  Medications Ordered in ED Medications  sodium chloride flush (NS) 0.9 % injection 3 mL (3 mLs Intravenous Not Given 04/23/18 0936)  ondansetron (ZOFRAN) injection 4 mg (4 mg Intravenous Given 04/23/18 1004)    ketorolac (TORADOL) 30 MG/ML injection 30 mg (30 mg Intravenous Given 04/23/18 1004)     Initial Impression / Assessment and Plan / ED Course  I have reviewed the triage vital signs and the nursing notes.  Pertinent labs & imaging results that were available during my care of the patient were reviewed by me and considered in my medical decision making (see chart  for details).       Given chronicity of symptoms, the fact that they have come and gone in same area for several months, I highly doubt appendicitis. CMP, CBC reassuring. UPT negative and UA normal. Obtained pelvic US which shows endometrial calcification suggestive of prior ablation.  I asked the patient about any previous ablation or instrumentation and she denied.  However, later review of her chart shows that in 2018 she had D&C for ablation and hysteroscopy for menorrhagia and dysmenorrhea. It is unclear why she was not forthcoming about this information/wanting to discuss.  I discussed today's ultrasound results with her and discussed patient's for further work-up including CT scan here versus OB/GYN follow-up.  Patient voiced understanding of options and elected to follow-up with her OB/GYN.  I have extensively reviewed return precautions including worsening pain, severe vomiting, fever, or new symptoms such as bloody stools, severe vaginal bleeding, or vaginal discharge.  She voiced understanding. Final Clinical Impressions(s) / ED Diagnoses   Final diagnoses:  Lower abdominal pain    ED Discharge Orders    None       Michaeal Davis, Ambrose [place], MD 04/23/18 1327

## 2018-08-03 ENCOUNTER — Other Ambulatory Visit: Payer: Self-pay | Admitting: Obstetrics and Gynecology

## 2018-09-20 ENCOUNTER — Other Ambulatory Visit (HOSPITAL_COMMUNITY)
Admission: RE | Admit: 2018-09-20 | Discharge: 2018-09-20 | Disposition: A | Discharge status: Home / Self Care | Payer: Medicaid Other | Source: Ambulatory Visit | Attending: Obstetrics and Gynecology | Admitting: Obstetrics and Gynecology

## 2018-09-20 DIAGNOSIS — Z20828 Contact with and (suspected) exposure to other viral communicable diseases: Secondary | ICD-10-CM | POA: Insufficient documentation

## 2018-09-20 LAB — SARS CORONAVIRUS 2 (TAT 6-24 HRS): SARS Coronavirus 2: NEGATIVE

## 2018-09-21 NOTE — Patient Instructions (Addendum)
DUE TO COVID-19 ONLY ONE VISITOR IS ALLOWED IN WAITING ROOM (VISITOR WILL HAVE A TEMPERATURE CHECK ON ARRIVAL AND MUST WEAR A FACE MASK THE ENTIRE TIME.  Your COVID swab testing was completed on September 20, 2018  , You must self quarantine after your testing per handout given to you at the testing site.    Your procedure is scheduled on: Thursday, September 23, 2018  Report to [facility] AT  10:30 A. M.   Call this number if you have problems the morning of surgery:  [phone].   OUR ADDRESS IS [address].  WE ARE LOCATED [place]                                   [facility].                                     REMEMBER:  DO NOT EAT FOOD OR DRINK LIQUIDS AFTER MIDNIGHT .    BRUSH YOUR TEETH THE MORNING OF SURGERY.  TAKE THESE MEDICATIONS MORNING OF SURGERY WITH A SIP OF WATER:    DO NOT WEAR JEWERLY, MAKE UP, OR NAIL POLISH.  DO NOT WEAR LOTIONS, POWDERS, PERFUMES/COLOGNE OR DEODORANT.  DO NOT SHAVE FOR 24 HOURS PRIOR TO DAY OF SURGERY.  CONTACTS, GLASSES, OR DENTURES MAY NOT BE WORN TO SURGERY.                                    [facility] IS NOT RESPONSIBLE  FOR ANY BELONGINGS.                                                                     [facility] Before surgery, you can play an important role.  Because skin is not sterile, your skin needs to be as free of germs as possible.  You can reduce the number of germs on your skin by washing with CHG (chlorahexidine gluconate) soap before surgery.  CHG is an antiseptic cleaner which kills germs and bonds with the skin to continue killing germs even after washing. Please DO NOT use if you have an allergy to CHG or antibacterial soaps.  If your skin becomes reddened/irritated stop using the CHG and inform your nurse when you arrive at Short Stay. Do not shave (including legs and underarms) for at least 48 hours prior to the first CHG shower.  You may shave your  face/neck.  Please follow these instructions carefully:  1.  Shower with CHG Soap the night before surgery and the  morning of surgery.  2.  If you choose to wash your hair, wash your hair first as usual with your normal  shampoo.  3.  After you shampoo, rinse your hair and body thoroughly to remove the shampoo.                             4.  Use CHG as you  would any other liquid soap.  You can apply chg directly to the skin and wash.  Gently with a scrungie or clean washcloth.  5.  Apply the CHG Soap to your body ONLY FROM THE NECK DOWN.   Do   not use on face/ open                           Wound or open sores. Avoid contact with eyes, ears mouth and   genitals (private parts).                       Wash face,  Genitals (private parts) with your normal soap.             6.  Wash thoroughly, paying special attention to the area where your    surgery  will be performed.  7.  Thoroughly rinse your body with warm water from the neck down.  8.  DO NOT shower/wash with your normal soap after using and rinsing off the CHG Soap.                9.  Pat yourself dry with a clean towel.            10.  Wear clean pajamas.            11.  Place clean sheets on your bed the night of your first shower and do not  sleep with pets. Day of Surgery : Do not apply any lotions/deodorants the morning of surgery.  Please wear clean clothes to the hospital/surgery center.  FAILURE TO FOLLOW THESE INSTRUCTIONS MAY RESULT IN THE CANCELLATION OF YOUR SURGERY  PATIENT SIGNATURE_________________________________  NURSE SIGNATURE__________________________________  ________________________________________________________________________  Steps to Quit Smoking Smoking tobacco is the leading cause of preventable death. It can affect almost every organ in the body. Smoking puts you and those around you at risk for developing many serious chronic diseases. Quitting smoking can be difficult, but it is one of the best  things that you can do for your health. It is never too late to quit. How do I get ready to quit? When you decide to quit smoking, create a plan to help you succeed. Before you quit:  Pick a date to quit. Set a date within the next 2 weeks to give you time to prepare.  Write down the reasons why you are quitting. Keep this list in places where you will see it often.  Tell your family, friends, and co-workers that you are quitting. Support from your loved ones can make quitting easier.  Talk with your health care provider about your options for quitting smoking.  Find out what treatment options are covered by your health insurance.  Identify people, places, things, and activities that make you want to smoke (triggers). Avoid them. What first steps can I take to quit smoking?  Throw away all cigarettes at home, at work, and in your car.  Throw away smoking accessories, such as Set designerashtrays and lighters.  Clean your car. Make sure to empty the ashtray.  Clean your home, including curtains and carpets. What strategies can I use to quit smoking? Talk with your health care provider about combining strategies, such as taking medicines while you are also receiving in-person counseling. Using these two strategies together makes you more likely to succeed in quitting than if you used either strategy on its own.  If you are pregnant or breastfeeding,  talk with your health care provider about finding counseling or other support strategies to quit smoking. Do not take medicine to help you quit smoking unless your health care provider tells you to do so. To quit smoking: Quit right away  Quit smoking completely, instead of gradually reducing how much you smoke over a period of time. Research shows that stopping smoking right away is more successful than gradually quitting.  Attend in-person counseling to help you build problem-solving skills. You are more likely to succeed in quitting if you attend  counseling sessions regularly. Even short sessions of 10 minutes can be effective. Take medicine You may take medicines to help you quit smoking. Some medicines require a prescription and some you can purchase over-the-counter. Medicines may have nicotine in them to replace the nicotine in cigarettes. Medicines may:  Help to stop cravings.  Help to relieve withdrawal symptoms. Your health care provider may recommend:  Nicotine patches, gum, or lozenges.  Nicotine inhalers or sprays.  Non-nicotine medicine that is taken by mouth. Find resources Find resources and support systems that can help you to quit smoking and remain smoke-free after you quit. These resources are most helpful when you use them often. They include:  Online chats with a counselor.  Telephone quitlinesVeterinary surgeon.  Printed Materials engineerself-help materials.  Support groups or group counseling.  Text messaging programs.  Mobile phone apps or applications. Use apps that can help you stick to your quit plan by providing reminders, tips, and encouragement. There are many free apps for mobile devices as well as websites. Examples include Quit Guide from the [employer] and smokefree.gov What things can I do to make it easier to quit?   Reach out to your family and friends for support and encouragement. Call telephone quitlines (1-800-QUIT-NOW), reach out to support groups, or work with a counselor for support.  Ask people who smoke to avoid smoking around you.  Avoid places that trigger you to smoke, such as bars, parties, or smoke-break areas at work.  Spend time with people who do not smoke.  Lessen the stress in your life. Stress can be a smoking trigger for some people. To lessen stress, try: ? Exercising regularly. ? Doing deep-breathing exercises. ? Doing yoga. ? Meditating. ? Performing a body scan. This involves closing your eyes, scanning your body from head to toe, and noticing which parts of your body are particularly tense. Try  to relax the muscles in those areas. How will I feel when I quit smoking? Day 1 to 3 weeks Within the first 24 hours of quitting smoking, you may start to feel withdrawal symptoms. These symptoms are usually most noticeable 2-3 days after quitting, but they usually do not last for more than 2-3 weeks. You may experience these symptoms:  Mood swings.  Restlessness, anxiety, or irritability.  Trouble concentrating.  Dizziness.  Strong cravings for sugary foods and nicotine.  Mild weight gain.  Constipation.  Nausea.  Coughing or a sore throat.  Changes in how the medicines that you take for unrelated issues work in your body.  Depression.  Trouble sleeping (insomnia). Week 3 and afterward After the first 2-3 weeks of quitting, you may start to notice more positive results, such as:  Improved sense of smell and taste.  Decreased coughing and sore throat.  Slower heart rate.  Lower blood pressure.  Clearer skin.  The ability to breathe more easily.  Fewer sick days. Quitting smoking can be very challenging. Do not get discouraged if you are  not successful the first time. Some people need to make many attempts to quit before they achieve long-term success. Do your best to stick to your quit plan, and talk with your health care provider if you have any questions or concerns. Summary  Smoking tobacco is the leading cause of preventable death. Quitting smoking is one of the best things that you can do for your health.  When you decide to quit smoking, create a plan to help you succeed.  Quit smoking right away, not slowly over a period of time.  When you start quitting, seek help from your health care provider, family, or friends. This information is not intended to replace advice given to you by your health care provider. Make sure you discuss any questions you have with your health care provider. Document Released: 02/04/2001 Document Revised: 04/30/2018 Document  Reviewed: 05/01/2018 Elsevier Patient Education  2020 [employer]  Coping with Quitting Smoking  Quitting smoking is a physical and mental challenge. You will face cravings, withdrawal symptoms, and temptation. Before quitting, work with your health care provider to make a plan that can help you cope. Preparation can help you quit and keep you from giving in. How can I cope with cravings? Cravings usually last for 5-10 minutes. If you get through it, the craving will pass. Consider taking the following actions to help you cope with cravings:  Keep your mouth busy: ? Chew sugar-free gum. ? Suck on hard candies or a straw. ? Brush your teeth.  Keep your hands and body busy: ? Immediately change to a different activity when you feel a craving. ? Squeeze or play with a ball. ? Do an activity or a hobby, like making bead jewelry, practicing needlepoint, or working with wood. ? Mix up your normal routine. ? Take a short exercise break. Go for a quick walk or run up and down stairs. ? Spend time in public places where smoking is not allowed.  Focus on doing something kind or helpful for someone else.  Call a friend or family member to talk during a craving.  Join a support group.  Call a quit line, such as 1-800-QUIT-NOW.  Talk with your health care provider about medicines that might help you cope with cravings and make quitting easier for you. How can I deal with withdrawal symptoms? Your body may experience negative effects as it tries to get used to not having nicotine in the system. These effects are called withdrawal symptoms. They may include:  Feeling hungrier than normal.  Trouble concentrating.  Irritability.  Trouble sleeping.  Feeling depressed.  Restlessness and agitation.  Craving a cigarette. To manage withdrawal symptoms:  Avoid places, people, and activities that trigger your cravings.  Remember why you want to quit.  Get plenty of sleep.  Avoid  coffee and other caffeinated drinks. These may worsen some of your symptoms. How can I handle social situations? Social situations can be difficult when you are quitting smoking, especially in the first few weeks. To manage this, you can:  Avoid parties, bars, and other social situations where people might be smoking.  Avoid alcohol.  Leave right away if you have the urge to smoke.  Explain to your family and friends that you are quitting smoking. Ask for understanding and support.  Plan activities with friends or family where smoking is not an option. What are some ways I can cope with stress? Wanting to smoke may cause stress, and stress can make you want to smoke.  Find ways to manage your stress. Relaxation techniques can help. For example:  Breathe slowly and deeply, in through your nose and out through your mouth.  Listen to soothing, relaxing music.  Talk with a family member or friend about your stress.  Light a candle.  Soak in a bath or take a shower.  Think about a peaceful place. What are some ways I can prevent weight gain? Be aware that many people gain weight after they quit smoking. However, not everyone does. To keep from gaining weight, have a plan in place before you quit and stick to the plan after you quit. Your plan should include:  Having healthy snacks. When you have a craving, it may help to: ? Eat plain popcorn, crunchy carrots, celery, or other cut vegetables. ? Chew sugar-free gum.  Changing how you eat: ? Eat small portion sizes at meals. ? Eat 4-6 small meals throughout the day instead of 1-2 large meals a day. ? Be mindful when you eat. Do not watch television or do other things that might distract you as you eat.  Exercising regularly: ? Make time to exercise each day. If you do not have time for a long workout, do short bouts of exercise for 5-10 minutes several times a day. ? Do some form of strengthening exercise, like weight lifting, and  some form of aerobic exercise, like running or swimming.  Drinking plenty of water or other low-calorie or no-calorie drinks. Drink 6-8 glasses of water daily, or as much as instructed by your health care provider. Summary  Quitting smoking is a physical and mental challenge. You will face cravings, withdrawal symptoms, and temptation to smoke again. Preparation can help you as you go through these challenges.  You can cope with cravings by keeping your mouth busy (such as by chewing gum), keeping your body and hands busy, and making calls to family, friends, or a helpline for people who want to quit smoking.  You can cope with withdrawal symptoms by avoiding places where people smoke, avoiding drinks with caffeine, and getting plenty of rest.  Ask your health care provider about the different ways to prevent weight gain, avoid stress, and handle social situations. This information is not intended to replace advice given to you by your health care provider. Make sure you discuss any questions you have with your health care provider. Document Released: 02/08/2016 Document Revised: 01/23/2017 Document Reviewed: 02/08/2016 Elsevier Patient Education  2020 [employer].

## 2018-09-22 ENCOUNTER — Encounter (HOSPITAL_COMMUNITY): Payer: Self-pay

## 2018-09-22 ENCOUNTER — Other Ambulatory Visit: Payer: Self-pay

## 2018-09-22 ENCOUNTER — Encounter (HOSPITAL_COMMUNITY)
Admission: RE | Admit: 2018-09-22 | Discharge: 2018-09-22 | Disposition: A | Discharge status: Home / Self Care | Payer: Medicaid Other | Source: Ambulatory Visit | Attending: Obstetrics and Gynecology | Admitting: Obstetrics and Gynecology

## 2018-09-22 DIAGNOSIS — Z01812 Encounter for preprocedural laboratory examination: Secondary | ICD-10-CM | POA: Insufficient documentation

## 2018-09-22 LAB — CBC
HCT: 42.9 % (ref 36.0–46.0)
Hemoglobin: 14.8 g/dL (ref 12.0–15.0)
MCH: 31.2 pg (ref 26.0–34.0)
MCHC: 34.5 g/dL (ref 30.0–36.0)
MCV: 90.5 fL (ref 80.0–100.0)
Platelets: 176 10*3/uL (ref 150–400)
RBC: 4.74 MIL/uL (ref 3.87–5.11)
RDW: 12.2 % (ref 11.5–15.5)
WBC: 5.1 10*3/uL (ref 4.0–10.5)
nRBC: 0 % (ref 0.0–0.2)

## 2018-09-22 NOTE — Progress Notes (Signed)
SPOKE [place] 19:   COUGH-- no  RUNNY NOSE--- no  SORE THROAT--- no  NASAL CONGESTION---- no  SNEEZING---- no  SHORTNESS OF BREATH--- no  DIFFICULTY BREATHING--- no  TEMP >100.0 ----- no  UNEXPLAINED BODY ACHES------ no  CHILLS -------- no  HEADACHES --------- no  LOSS OF SMELL/ TASTE -------- no    HAVE YOU OR ANY FAMILY MEMBER TRAVELLED PAST 14 DAYS OUT OF THE  COUNTY--- no STATE---- no COUNTRY---- no  HAVE YOU OR ANY FAMILY MEMBER BEEN EXPOSED TO ANYONE WITH COVID 19? no

## 2018-09-23 ENCOUNTER — Ambulatory Visit (HOSPITAL_BASED_OUTPATIENT_CLINIC_OR_DEPARTMENT_OTHER): Payer: Medicaid Other | Admitting: Anesthesiology

## 2018-09-23 ENCOUNTER — Observation Stay (HOSPITAL_BASED_OUTPATIENT_CLINIC_OR_DEPARTMENT_OTHER)
Admission: RE | Admit: 2018-09-23 | Discharge: 2018-09-24 | Disposition: A | Discharge status: Home / Self Care | Payer: Medicaid Other | Attending: Obstetrics and Gynecology | Admitting: Obstetrics and Gynecology

## 2018-09-23 ENCOUNTER — Other Ambulatory Visit: Payer: Self-pay

## 2018-09-23 ENCOUNTER — Encounter (HOSPITAL_BASED_OUTPATIENT_CLINIC_OR_DEPARTMENT_OTHER): Payer: Self-pay | Admitting: Anesthesiology

## 2018-09-23 ENCOUNTER — Encounter (HOSPITAL_BASED_OUTPATIENT_CLINIC_OR_DEPARTMENT_OTHER)
Admission: RE | Disposition: A | Discharge status: Home / Self Care | Payer: Self-pay | Source: Home / Self Care | Attending: Obstetrics and Gynecology

## 2018-09-23 ENCOUNTER — Ambulatory Visit (HOSPITAL_BASED_OUTPATIENT_CLINIC_OR_DEPARTMENT_OTHER): Payer: Medicaid Other | Admitting: Physician Assistant

## 2018-09-23 DIAGNOSIS — R112 Nausea with vomiting, unspecified: Secondary | ICD-10-CM | POA: Insufficient documentation

## 2018-09-23 DIAGNOSIS — G8929 Other chronic pain: Secondary | ICD-10-CM | POA: Diagnosis present

## 2018-09-23 DIAGNOSIS — D509 Iron deficiency anemia, unspecified: Secondary | ICD-10-CM | POA: Diagnosis not present

## 2018-09-23 DIAGNOSIS — N888 Other specified noninflammatory disorders of cervix uteri: Secondary | ICD-10-CM | POA: Diagnosis not present

## 2018-09-23 DIAGNOSIS — N838 Other noninflammatory disorders of ovary, fallopian tube and broad ligament: Secondary | ICD-10-CM | POA: Insufficient documentation

## 2018-09-23 DIAGNOSIS — F17210 Nicotine dependence, cigarettes, uncomplicated: Secondary | ICD-10-CM | POA: Diagnosis not present

## 2018-09-23 DIAGNOSIS — R102 Pelvic and perineal pain: Secondary | ICD-10-CM | POA: Diagnosis present

## 2018-09-23 HISTORY — PX: LAPAROSCOPIC BILATERAL SALPINGECTOMY: SHX5889

## 2018-09-23 HISTORY — PX: VAGINAL HYSTERECTOMY: SHX2639

## 2018-09-23 LAB — CBC
HCT: 46.4 % — ABNORMAL HIGH (ref 36.0–46.0)
Hemoglobin: 15.9 g/dL — ABNORMAL HIGH (ref 12.0–15.0)
MCH: 31.3 pg (ref 26.0–34.0)
MCHC: 34.3 g/dL (ref 30.0–36.0)
MCV: 91.3 fL (ref 80.0–100.0)
Platelets: 173 10*3/uL (ref 150–400)
RBC: 5.08 MIL/uL (ref 3.87–5.11)
RDW: 12.1 % (ref 11.5–15.5)
WBC: 5.2 10*3/uL (ref 4.0–10.5)
nRBC: 0 % (ref 0.0–0.2)

## 2018-09-23 LAB — TYPE AND SCREEN
ABO/RH(D): A NEG
Antibody Screen: NEGATIVE

## 2018-09-23 LAB — ABO/RH: ABO/RH(D): A NEG

## 2018-09-23 LAB — POCT PREGNANCY, URINE: Preg Test, Ur: NEGATIVE

## 2018-09-23 SURGERY — HYSTERECTOMY, VAGINAL
Anesthesia: General | Site: Vagina

## 2018-09-23 MED ORDER — KETOROLAC TROMETHAMINE 30 MG/ML IJ SOLN
INTRAMUSCULAR | Status: DC | PRN
  Administered 2018-09-23: 30 mg via INTRAVENOUS

## 2018-09-23 MED ORDER — OXYCODONE-ACETAMINOPHEN 5-325 MG PO TABS
ORAL_TABLET | ORAL | Status: AC
  Filled 2018-09-23: qty 2

## 2018-09-23 MED ORDER — CLINDAMYCIN PHOSPHATE 900 MG/50ML IV SOLN
INTRAVENOUS | Status: AC
  Filled 2018-09-23: qty 50

## 2018-09-23 MED ORDER — LIDOCAINE 2% (20 MG/ML) 5 ML SYRINGE
INTRAMUSCULAR | Status: DC | PRN
  Administered 2018-09-23: 100 mg via INTRAVENOUS

## 2018-09-23 MED ORDER — ONDANSETRON HCL 4 MG/2ML IJ SOLN
INTRAMUSCULAR | Status: AC
  Filled 2018-09-23: qty 2

## 2018-09-23 MED ORDER — CLINDAMYCIN PHOSPHATE 900 MG/50ML IV SOLN
900.0000 mg | INTRAVENOUS | Status: DC
  Filled 2018-09-23: qty 50

## 2018-09-23 MED ORDER — ROCURONIUM BROMIDE 10 MG/ML (PF) SYRINGE
PREFILLED_SYRINGE | INTRAVENOUS | Status: DC | PRN
  Administered 2018-09-23: 50 mg via INTRAVENOUS

## 2018-09-23 MED ORDER — FENTANYL CITRATE (PF) 250 MCG/5ML IJ SOLN
INTRAMUSCULAR | Status: AC
  Filled 2018-09-23: qty 5

## 2018-09-23 MED ORDER — SIMETHICONE 80 MG PO CHEW
80.0000 mg | CHEWABLE_TABLET | Freq: Four times a day (QID) | ORAL | Status: DC | PRN
  Administered 2018-09-23: 16:00:00 80 mg via ORAL
  Filled 2018-09-23: qty 1

## 2018-09-23 MED ORDER — DEXAMETHASONE SODIUM PHOSPHATE 10 MG/ML IJ SOLN
INTRAMUSCULAR | Status: AC
  Filled 2018-09-23: qty 1

## 2018-09-23 MED ORDER — ONDANSETRON HCL 4 MG/2ML IJ SOLN
INTRAMUSCULAR | Status: DC | PRN
  Administered 2018-09-23: 4 mg via INTRAVENOUS

## 2018-09-23 MED ORDER — ONDANSETRON HCL 4 MG/2ML IJ SOLN
4.0000 mg | Freq: Four times a day (QID) | INTRAMUSCULAR | Status: DC | PRN
  Administered 2018-09-23 (×2): 4 mg via INTRAVENOUS
  Filled 2018-09-23: qty 2

## 2018-09-23 MED ORDER — OXYCODONE-ACETAMINOPHEN 5-325 MG PO TABS
1.0000 | ORAL_TABLET | ORAL | Status: DC | PRN
  Administered 2018-09-23 (×2): 1 via ORAL
  Administered 2018-09-24 (×2): 2 via ORAL
  Filled 2018-09-23: qty 2

## 2018-09-23 MED ORDER — LACTATED RINGERS IV SOLN
INTRAVENOUS | Status: DC
  Administered 2018-09-23: 13:00:00 via INTRAVENOUS
  Administered 2018-09-23: 12:00:00 1000 mL via INTRAVENOUS
  Filled 2018-09-23: qty 1000

## 2018-09-23 MED ORDER — GENTAMICIN SULFATE 40 MG/ML IJ SOLN
5.0000 mg/kg | INTRAVENOUS | Status: DC
  Filled 2018-09-23: qty 6.75

## 2018-09-23 MED ORDER — SCOPOLAMINE 1 MG/3DAYS TD PT72
MEDICATED_PATCH | TRANSDERMAL | Status: AC
  Filled 2018-09-23: qty 1

## 2018-09-23 MED ORDER — MIDAZOLAM HCL 2 MG/2ML IJ SOLN
INTRAMUSCULAR | Status: DC | PRN
  Administered 2018-09-23: 2 mg via INTRAVENOUS

## 2018-09-23 MED ORDER — ACETAMINOPHEN 10 MG/ML IV SOLN
INTRAVENOUS | Status: AC
  Filled 2018-09-23: qty 100

## 2018-09-23 MED ORDER — ACETAMINOPHEN 10 MG/ML IV SOLN
INTRAVENOUS | Status: DC | PRN
  Administered 2018-09-23: 1000 mg via INTRAVENOUS

## 2018-09-23 MED ORDER — LIDOCAINE-EPINEPHRINE 1 %-1:100000 IJ SOLN
INTRAMUSCULAR | Status: DC | PRN
  Administered 2018-09-23: 10 mL

## 2018-09-23 MED ORDER — DOCUSATE SODIUM 100 MG PO CAPS
100.0000 mg | ORAL_CAPSULE | Freq: Two times a day (BID) | ORAL | Status: DC
  Administered 2018-09-23: 22:00:00 100 mg via ORAL
  Filled 2018-09-23: qty 1

## 2018-09-23 MED ORDER — CEFAZOLIN SODIUM-DEXTROSE 2-4 GM/100ML-% IV SOLN
2.0000 g | Freq: Once | INTRAVENOUS | Status: DC
  Filled 2018-09-23: qty 100

## 2018-09-23 MED ORDER — PROMETHAZINE HCL 25 MG/ML IJ SOLN
25.0000 mg | Freq: Once | INTRAMUSCULAR | Status: AC
  Administered 2018-09-23: 12.5 mg via INTRAVENOUS
  Filled 2018-09-23: qty 1

## 2018-09-23 MED ORDER — SCOPOLAMINE 1 MG/3DAYS TD PT72
1.0000 | MEDICATED_PATCH | TRANSDERMAL | Status: DC
  Administered 2018-09-23: 1.5 mg via TRANSDERMAL
  Filled 2018-09-23: qty 1

## 2018-09-23 MED ORDER — MIDAZOLAM HCL 2 MG/2ML IJ SOLN
INTRAMUSCULAR | Status: AC
  Filled 2018-09-23: qty 2

## 2018-09-23 MED ORDER — PROPOFOL 10 MG/ML IV BOLUS
INTRAVENOUS | Status: AC
  Filled 2018-09-23: qty 40

## 2018-09-23 MED ORDER — PROMETHAZINE HCL 25 MG/ML IJ SOLN
INTRAMUSCULAR | Status: AC
  Filled 2018-09-23: qty 1

## 2018-09-23 MED ORDER — CEFAZOLIN SODIUM-DEXTROSE 2-4 GM/100ML-% IV SOLN
INTRAVENOUS | Status: AC
  Filled 2018-09-23: qty 100

## 2018-09-23 MED ORDER — LIDOCAINE 2% (20 MG/ML) 5 ML SYRINGE
INTRAMUSCULAR | Status: AC
  Filled 2018-09-23: qty 5

## 2018-09-23 MED ORDER — KETOROLAC TROMETHAMINE 30 MG/ML IJ SOLN
30.0000 mg | Freq: Once | INTRAMUSCULAR | Status: DC | PRN
  Filled 2018-09-23: qty 1

## 2018-09-23 MED ORDER — IBUPROFEN 200 MG PO TABS
ORAL_TABLET | ORAL | Status: AC
  Filled 2018-09-23: qty 3

## 2018-09-23 MED ORDER — PROMETHAZINE HCL 25 MG/ML IJ SOLN
6.2500 mg | INTRAMUSCULAR | Status: DC | PRN
  Filled 2018-09-23: qty 1

## 2018-09-23 MED ORDER — SODIUM CHLORIDE 0.9 % IV SOLN
2.0000 g | Freq: Two times a day (BID) | INTRAVENOUS | Status: DC
  Filled 2018-09-23: qty 2

## 2018-09-23 MED ORDER — DEXTROSE IN LACTATED RINGERS 5 % IV SOLN
INTRAVENOUS | Status: DC
  Administered 2018-09-23: 125 mL/h via INTRAVENOUS
  Administered 2018-09-24: 01:00:00 via INTRAVENOUS
  Filled 2018-09-23 (×3): qty 1000

## 2018-09-23 MED ORDER — IBUPROFEN 600 MG PO TABS
600.0000 mg | ORAL_TABLET | Freq: Four times a day (QID) | ORAL | Status: DC
  Administered 2018-09-23 – 2018-09-24 (×3): 600 mg via ORAL
  Filled 2018-09-23: qty 1

## 2018-09-23 MED ORDER — HYDROMORPHONE HCL 1 MG/ML IJ SOLN
INTRAMUSCULAR | Status: AC
  Filled 2018-09-23: qty 2

## 2018-09-23 MED ORDER — OXYCODONE-ACETAMINOPHEN 5-325 MG PO TABS
ORAL_TABLET | ORAL | Status: AC
  Filled 2018-09-23: qty 1

## 2018-09-23 MED ORDER — PROPOFOL 10 MG/ML IV BOLUS
INTRAVENOUS | Status: DC | PRN
  Administered 2018-09-23: 200 mg via INTRAVENOUS

## 2018-09-23 MED ORDER — MEPERIDINE HCL 25 MG/ML IJ SOLN
6.2500 mg | INTRAMUSCULAR | Status: DC | PRN
  Filled 2018-09-23: qty 1

## 2018-09-23 MED ORDER — HYDROMORPHONE HCL 2 MG/ML IJ SOLN
2.0000 mg | INTRAMUSCULAR | Status: DC | PRN
  Administered 2018-09-23 – 2018-09-24 (×2): 2 mg via INTRAVENOUS
  Filled 2018-09-23: qty 1

## 2018-09-23 MED ORDER — HYDROMORPHONE HCL 1 MG/ML IJ SOLN
0.2500 mg | INTRAMUSCULAR | Status: DC | PRN
  Administered 2018-09-23 (×3): 0.5 mg via INTRAVENOUS
  Filled 2018-09-23: qty 0.5

## 2018-09-23 MED ORDER — ONDANSETRON HCL 4 MG PO TABS
4.0000 mg | ORAL_TABLET | Freq: Four times a day (QID) | ORAL | Status: DC | PRN
  Filled 2018-09-23: qty 1

## 2018-09-23 MED ORDER — HYDROMORPHONE HCL 1 MG/ML IJ SOLN
INTRAMUSCULAR | Status: AC
  Filled 2018-09-23: qty 1

## 2018-09-23 MED ORDER — IBUPROFEN 600 MG PO TABS
600.0000 mg | ORAL_TABLET | Freq: Four times a day (QID) | ORAL | Status: DC | PRN
  Filled 2018-09-23: qty 1

## 2018-09-23 MED ORDER — KETOROLAC TROMETHAMINE 30 MG/ML IJ SOLN
INTRAMUSCULAR | Status: AC
  Filled 2018-09-23: qty 1

## 2018-09-23 MED ORDER — SUGAMMADEX SODIUM 200 MG/2ML IV SOLN
INTRAVENOUS | Status: DC | PRN
  Administered 2018-09-23: 150 mg via INTRAVENOUS
  Administered 2018-09-23: 100 mg via INTRAVENOUS
  Administered 2018-09-23: 200 mg via INTRAVENOUS

## 2018-09-23 MED ORDER — FENTANYL CITRATE (PF) 100 MCG/2ML IJ SOLN
INTRAMUSCULAR | Status: DC | PRN
  Administered 2018-09-23 (×2): 50 ug via INTRAVENOUS
  Administered 2018-09-23: 100 ug via INTRAVENOUS
  Administered 2018-09-23: 50 ug via INTRAVENOUS

## 2018-09-23 MED ORDER — ROCURONIUM BROMIDE 10 MG/ML (PF) SYRINGE
PREFILLED_SYRINGE | INTRAVENOUS | Status: AC
  Filled 2018-09-23: qty 10

## 2018-09-23 MED ORDER — DOCUSATE SODIUM 100 MG PO CAPS
ORAL_CAPSULE | ORAL | Status: AC
  Filled 2018-09-23: qty 1

## 2018-09-23 MED ORDER — DEXAMETHASONE SODIUM PHOSPHATE 10 MG/ML IJ SOLN
INTRAMUSCULAR | Status: DC | PRN
  Administered 2018-09-23: 10 mg via INTRAVENOUS

## 2018-09-23 MED ORDER — CEFAZOLIN SODIUM-DEXTROSE 2-3 GM-%(50ML) IV SOLR
INTRAVENOUS | Status: DC | PRN
  Administered 2018-09-23: 2 g via INTRAVENOUS

## 2018-09-23 MED ORDER — SIMETHICONE 80 MG PO CHEW
CHEWABLE_TABLET | ORAL | Status: AC
  Filled 2018-09-23: qty 1

## 2018-09-23 SURGICAL SUPPLY — 43 items
CATH ROBINSON RED A/P 14FR (CATHETERS) ×4 IMPLANT
CLOSURE WOUND 1/4 X3 (GAUZE/BANDAGES/DRESSINGS)
COVER BACK TABLE 60X90IN (DRAPES) ×4 IMPLANT
COVER MAYO STAND STRL (DRAPES) ×8 IMPLANT
COVER WAND RF STERILE (DRAPES) ×4 IMPLANT
DECANTER SPIKE VIAL GLASS SM (MISCELLANEOUS) IMPLANT
DERMABOND ADVANCED (GAUZE/BANDAGES/DRESSINGS) ×2
DERMABOND ADVANCED .7 DNX12 (GAUZE/BANDAGES/DRESSINGS) ×2 IMPLANT
DRSG OPSITE POSTOP 3X4 (GAUZE/BANDAGES/DRESSINGS) IMPLANT
ELECT REM PT RETURN 9FT ADLT (ELECTROSURGICAL) ×4
ELECTRODE REM PT RTRN 9FT ADLT (ELECTROSURGICAL) ×2 IMPLANT
FORCEPS CUTTING 33CM 5MM (CUTTING FORCEPS) IMPLANT
GAUZE 4X4 16PLY RFD (DISPOSABLE) ×4 IMPLANT
GLOVE BIOGEL PI IND STRL 7.0 (GLOVE) ×4 IMPLANT
GLOVE BIOGEL PI INDICATOR 7.0 (GLOVE) ×4
GLOVE ECLIPSE 7.0 STRL STRAW (GLOVE) ×12 IMPLANT
HOLDER FOLEY CATH W/STRAP (MISCELLANEOUS) ×4 IMPLANT
KIT TURNOVER CYSTO (KITS) ×4 IMPLANT
NS IRRIG 500ML POUR BTL (IV SOLUTION) ×4 IMPLANT
PACK LAVH (CUSTOM PROCEDURE TRAY) ×4 IMPLANT
PACK TRENDGUARD 450 HYBRID PRO (MISCELLANEOUS) ×2 IMPLANT
PAD OB MATERNITY 4.3X12.25 (PERSONAL CARE ITEMS) ×4 IMPLANT
SCISSORS LAP 5X35 DISP (ENDOMECHANICALS) IMPLANT
SET IRRIG TUBING LAPAROSCOPIC (IRRIGATION / IRRIGATOR) ×4 IMPLANT
SHEET LAVH (DRAPES) ×2 IMPLANT
STRIP CLOSURE SKIN 1/4X3 (GAUZE/BANDAGES/DRESSINGS) IMPLANT
SUT MNCRL 0 MO-4 VIOLET 18 CR (SUTURE) ×4 IMPLANT
SUT MNCRL 0 VIOLET 6X18 (SUTURE) IMPLANT
SUT MONOCRYL 0 6X18 (SUTURE)
SUT MONOCRYL 0 MO 4 18  CR/8 (SUTURE) ×6
SUT VIC AB 2-0 CT1 27 (SUTURE) ×6
SUT VIC AB 2-0 CT1 TAPERPNT 27 (SUTURE) ×4 IMPLANT
SUT VICRYL 0 UR6 27IN ABS (SUTURE) ×4 IMPLANT
SUT VICRYL RAPIDE 3 0 (SUTURE) IMPLANT
SYR BULB IRRIGATION 50ML (SYRINGE) IMPLANT
TOWEL OR 17X26 10 PK STRL BLUE (TOWEL DISPOSABLE) ×4 IMPLANT
TRAY FOLEY BAG SILVER LF 14FR (CATHETERS) ×4 IMPLANT
TRENDGUARD 450 HYBRID PRO PACK (MISCELLANEOUS) ×4
TROCAR BALLN 12MMX100 BLUNT (TROCAR) ×4 IMPLANT
TROCAR XCEL NON-BLD 5MMX100MML (ENDOMECHANICALS) ×8 IMPLANT
TUBING EVAC SMOKE HEATED PNEUM (TUBING) ×4 IMPLANT
WARMER LAPAROSCOPE (MISCELLANEOUS) ×4 IMPLANT
WATER STERILE IRR 500ML POUR (IV SOLUTION) ×4 IMPLANT

## 2018-09-23 NOTE — Transfer of Care (Signed)
  Last Vitals:  Vitals Value Taken Time  BP 134/98 09/23/18 1318  Temp 36.4 C 09/23/18 1318  Pulse 78 09/23/18 1329  Resp 19 09/23/18 1329  SpO2 100 % 09/23/18 1329  Vitals shown include unvalidated device data.  Last Pain:  Vitals:   09/23/18 1318  TempSrc:   PainSc: 5       Patients Stated Pain Goal: 6 (09/23/18 1126)  Immediate Anesthesia Transfer of Care Note  Patient: Jeanne Lyons  Procedure(s) Performed: Procedure(s) (LRB): HYSTERECTOMY VAGINAL (N/A) BILATERAL SALPINGECTOMY (Bilateral)  Patient Location: PACU  Anesthesia Type: General  Level of Consciousness: awake, alert  and oriented  Airway & Oxygen Therapy: Patient Spontanous Breathing and Patient connected to nasal cannula oxygen  Post-op Assessment: Report given to PACU RN and Post -op Vital signs reviewed and stable  Post vital signs: Reviewed and stable  Complications: No apparent anesthesia complications

## 2018-09-23 NOTE — H&P (Signed)
Jeanne Lyons is an 33 y.o. [phone] white female who presents to the OR for a TVH possible bilateral salpingectomy for recurrent pelvic pain after an endometrial ablation in 2 /18. Pt was felt to possibly have a hematometria after the Novasure ablation however no fluid collection has been seen on multiple u/s. She also has a h.o. chronic muscle aches. She has seen Rheum for this. She has nl GI/GU function.   Past Medical History:  Diagnosis Date  . Anxiety   . Blood type, Rh negative   . Depression   . History of abnormal cervical Pap smear   . Hypertension    with pregnancy  . Iron deficiency anemia     Past Surgical History:  Procedure Laterality Date  . CERVICAL BIOPSY  W/ LOOP ELECTRODE EXCISION  aug 2014  . [place] N/A 04/15/2016   Procedure: DILATATION & CURETTAGE/HYSTEROSCOPY WITH NOVASURE ABLATION;  Surgeon: Olga Millers, MD;  Location: [facility];  Service: Gynecology;  Laterality: N/A;  . LAPAROSCOPIC TUBAL LIGATION Bilateral 04/13/2014   Procedure: LAPAROSCOPIC TUBAL LIGATION;  Surgeon: Olga Millers, MD;  Location: [facility];  Service: Gynecology;  Laterality: Bilateral;    Family History  Problem Relation Age of Onset  . Asthma Father   . Diabetes Father   . Hypertension Father   . Anemia Mother        B12 deficiency  . Mental retardation Brother   . Down syndrome Cousin   . Down syndrome Cousin   . Other Neg Hx    Social History:  reports that she has been smoking cigarettes. She has a 5.00 pack-year smoking history. She has never used smokeless tobacco. She reports current alcohol use. She reports current drug use. Drug: Marijuana.  Allergies:  Allergies  Allergen Reactions  . Amoxicillin Rash    Did it involve swelling of the face/tongue/throat, SOB, or low BP? No Did it involve sudden or severe rash/hives, skin peeling, or any reaction on the inside of your mouth or nose? No Did you need to  seek medical attention at a hospital or doctor's office? No When did it last happen?childhood allergy If all above answers are "NO", may proceed with cephalosporin use.   Marland Kitchen Penicillins Rash    Did it involve swelling of the face/tongue/throat, SOB, or low BP? No Did it involve sudden or severe rash/hives, skin peeling, or any reaction on the inside of your mouth or nose? No Did you need to seek medical attention at a hospital or doctor's office? No When did it last happen?childhood allergy If all above answers are "NO", may proceed with cephalosporin use.    Medications Prior to Admission  Medication Sig Dispense Refill  . oxycodone-acetaminophen (PERCOCET) 2.5-325 MG tablet Take 1 tablet by mouth every 4 (four) hours as needed for pain. (Patient not taking: Reported on 09/17/2018) 15 tablet 0       Blood pressure 117/82, pulse 73, temperature 98.3 F (36.8 C), temperature source Oral, resp. rate 14, weight 56.2 kg, last menstrual period 08/25/2018, SpO2 100 %. General appearance: alert and cooperative Abdomen: soft, non-tender; bowel sounds normal; no masses,  no organomegaly   Lab Results  Component Value Date   WBC 5.1 09/22/2018   HGB 14.8 09/22/2018   HCT 42.9 09/22/2018   MCV 90.5 09/22/2018   PLT 176 09/22/2018   Lab Results  Component Value Date   PREGTESTUR NEGATIVE 04/23/2018      IMP/ Chronic  pelvic pain          Possible hematometria from ablation Plan/TVH  Miller Limehouse E 09/23/2018, 10:53 AM

## 2018-09-23 NOTE — Anesthesia Preprocedure Evaluation (Signed)
Anesthesia Evaluation  Patient identified by MRN, date of birth, ID band Patient awake    Reviewed: Allergy & Precautions, NPO status , Patient's Chart, lab work & pertinent test results  Airway Mallampati: I       Dental no notable dental hx. (+) Teeth Intact   Pulmonary neg pulmonary ROS, Current Smoker,    Pulmonary exam normal breath sounds clear to auscultation       Cardiovascular hypertension, Normal cardiovascular exam Rhythm:Regular Rate:Normal     Neuro/Psych negative neurological ROS     GI/Hepatic negative GI ROS, Neg liver ROS,   Endo/Other  negative endocrine ROS  Renal/GU negative Renal ROS  negative genitourinary   Musculoskeletal negative musculoskeletal ROS (+)   Abdominal Normal abdominal exam  (+)   Peds negative pediatric ROS (+)  Hematology negative hematology ROS (+)   Anesthesia Other Findings   Reproductive/Obstetrics negative OB ROS                             Anesthesia Physical Anesthesia Plan  ASA: II  Anesthesia Plan: General   Post-op Pain Management:    Induction: Intravenous  PONV Risk Score and Plan: 4 or greater and Ondansetron, Dexamethasone and Scopolamine patch - Pre-op  Airway Management Planned: Oral ETT  Additional Equipment: None  Intra-op Plan:   Post-operative Plan: Extubation in OR  Informed Consent: I have reviewed the patients History and Physical, chart, labs and discussed the procedure including the risks, benefits and alternatives for the proposed anesthesia with the patient or authorized representative who has indicated his/her understanding and acceptance.     Dental advisory given  Plan Discussed with: CRNA  Anesthesia Plan Comments:         Anesthesia Quick Evaluation

## 2018-09-23 NOTE — Anesthesia Procedure Notes (Signed)
Procedure Name: Intubation Date/Time: 09/23/2018 11:52 AM Performed by: Lyn Hollingshead, MD Pre-anesthesia Checklist: Patient identified, Emergency Drugs available, Suction available and Patient being monitored Patient Re-evaluated:Patient Re-evaluated prior to induction Oxygen Delivery Method: Circle system utilized Preoxygenation: Pre-oxygenation with 100% oxygen Induction Type: IV induction Ventilation: Mask ventilation without difficulty Laryngoscope Size: Mac and 3 Grade View: Grade I Tube type: Oral Tube size: 7.0 mm Number of attempts: 1 Airway Equipment and Method: Stylet and Oral airway Placement Confirmation: ETT inserted through vocal cords under direct vision,  positive ETCO2 and breath sounds checked- equal and bilateral Secured at: 21 cm Tube secured with: Tape Dental Injury: Teeth and Oropharynx as per pre-operative assessment

## 2018-09-23 NOTE — Anesthesia Postprocedure Evaluation (Signed)
Anesthesia Post Note  Patient: Jeanne Lyons  Procedure(s) Performed: HYSTERECTOMY VAGINAL (N/A Vagina ) BILATERAL SALPINGECTOMY (Bilateral Vagina )     Patient location during evaluation: PACU Anesthesia Type: General Level of consciousness: sedated Pain management: pain level not controlled Vital Signs Assessment: post-procedure vital signs reviewed and stable Respiratory status: spontaneous breathing Cardiovascular status: stable Postop Assessment: no apparent nausea or vomiting Anesthetic complications: no    Last Vitals:  Vitals:   09/23/18 1345 09/23/18 1400  BP: (!) 127/96 (!) 120/98  Pulse: 66 67  Resp: (!) 9 10  Temp:    SpO2: 100% 98%    Last Pain:  Vitals:   09/23/18 1345  TempSrc:   PainSc: 7    Pain Goal: Patients Stated Pain Goal: 6 (09/23/18 1126)                 Huston Foley

## 2018-09-24 ENCOUNTER — Encounter (HOSPITAL_BASED_OUTPATIENT_CLINIC_OR_DEPARTMENT_OTHER): Payer: Self-pay | Admitting: Obstetrics and Gynecology

## 2018-09-24 DIAGNOSIS — R102 Pelvic and perineal pain: Secondary | ICD-10-CM | POA: Diagnosis not present

## 2018-09-24 LAB — CBC
HCT: 36.3 % (ref 36.0–46.0)
Hemoglobin: 12.3 g/dL (ref 12.0–15.0)
MCH: 31 pg (ref 26.0–34.0)
MCHC: 33.9 g/dL (ref 30.0–36.0)
MCV: 91.4 fL (ref 80.0–100.0)
Platelets: 155 10*3/uL (ref 150–400)
RBC: 3.97 MIL/uL (ref 3.87–5.11)
RDW: 12.1 % (ref 11.5–15.5)
WBC: 11.3 10*3/uL — ABNORMAL HIGH (ref 4.0–10.5)
nRBC: 0 % (ref 0.0–0.2)

## 2018-09-24 MED ORDER — DOCUSATE SODIUM 100 MG PO CAPS: 100.0000 mg | ORAL_CAPSULE | Freq: Two times a day (BID) | ORAL | 0 refills | Status: AC

## 2018-09-24 MED ORDER — IBUPROFEN 200 MG PO TABS
ORAL_TABLET | ORAL | Status: AC
  Filled 2018-09-24: qty 3

## 2018-09-24 MED ORDER — HYDROMORPHONE HCL 1 MG/ML IJ SOLN
INTRAMUSCULAR | Status: AC
  Filled 2018-09-24: qty 2

## 2018-09-24 MED ORDER — IBUPROFEN 600 MG PO TABS: 600.0000 mg | ORAL_TABLET | Freq: Four times a day (QID) | ORAL | 0 refills | Status: AC

## 2018-09-24 MED ORDER — ONDANSETRON HCL 4 MG PO TABS: 4.0000 mg | ORAL_TABLET | Freq: Four times a day (QID) | ORAL | 0 refills | Status: AC | PRN

## 2018-09-24 MED ORDER — OXYCODONE-ACETAMINOPHEN 5-325 MG PO TABS
ORAL_TABLET | ORAL | Status: AC
  Filled 2018-09-24: qty 2

## 2018-09-24 NOTE — Discharge Instructions (Signed)
Vaginal Hysterectomy, Care After °Refer to this sheet in the next few weeks. These instructions provide you with information about caring for yourself after your procedure. Your health care provider may also give you more specific instructions. Your treatment has been planned according to current medical practices, but problems sometimes occur. Call your health care provider if you have any problems or questions after your procedure. °What can I expect after the procedure? °After the procedure, it is common to have: °· Pain. °· Soreness and numbness in your incision areas. °· Vaginal bleeding and discharge. °· Constipation. °· Temporary problems emptying the bladder. °· Feelings of sadness or other emotions. °Follow these instructions at home: °Medicines °· Take over-the-counter and prescription medicines only as told by your health care provider. °· If you were prescribed an antibiotic medicine, take it as told by your health care provider. Do not stop taking the antibiotic even if you start to feel better. °· Do not drive or operate heavy machinery while taking prescription pain medicine. °Activity °· Return to your normal activities as told by your health care provider. Ask your health care provider what activities are safe for you. °· Get regular exercise as told by your health care provider. You may be told to take short walks every day and go farther each time. °· Do not lift anything that is heavier than 10 lb (4.5 kg). °General instructions ° °· Do not put anything in your vagina for 6 weeks after your surgery or as told by your health care provider. This includes tampons and douches. °· Do not have sex until your health care provider says you can. °· Do not take baths, swim, or use a hot tub until your health care provider approves. °· Drink enough fluid to keep your urine clear or pale yellow. °· Do not drive for 24 hours if you were given a sedative. °· Keep all follow-up visits as told by your health  care provider. This is important. °Contact a health care provider if: °· Your pain medicine is not helping. °· You have a fever. °· You have redness, swelling, or pain at your incision site. °· You have blood, pus, or a bad-smelling discharge from your vagina. °· You continue to have difficulty urinating. °Get help right away if: °· You have severe abdominal or back pain. °· You have heavy bleeding from your vagina. °· You have chest pain or shortness of breath. °This information is not intended to replace advice given to you by your health care provider. Make sure you discuss any questions you have with your health care provider. °Document Released: 06/04/2015 Document Revised: 10/04/2015 Document Reviewed: 02/25/2015 °Elsevier Patient Education © 2020 [employer] ° °

## 2018-09-24 NOTE — Discharge Summary (Signed)
  Pt was admitted on 09/23/18 for a TVH with possible Bilateral salpingectomy for chronic pelvic pain.She had a uncomplicated procedure and was admitted to the [facility] post op. Please read op note for complete description. She had some N/V post op which resolved, otherwise she did well post op.She was tolerating a regular diet, ambulating, voiding, having adequate pain control on POD#1. She was discharged to home and will return to the office in 5 days. Nl Vitals and CBC at time of discharge

## 2018-09-24 NOTE — Progress Notes (Signed)
POD#1 Pt had N/V last pm .Now resolved. Tolerating diet. Using motrin and percocet for pain. Minimal vaginal bleeding. VSSAF Abd- soft, non tender, non distended          No CVAT          Ext - wnl IMP/ S/P TVH bilateral salpingectomy          Stable Plan/ Will discharge to home            Percocet           Zofran           Motrin           colace

## 2018-09-24 NOTE — Op Note (Signed)
Jeanne Lyons, BABEL MEDICAL RECORD FY:[id number] ACCOUNT [id number] DATE OF BIRTH:04/09/1985 FACILITY: [facility] LOCATION: [facility] PHYSICIAN:Philena Obey Kathline Magic, MD  OPERATIVE REPORT  DATE OF PROCEDURE:  09/23/2018  PREOPERATIVE DIAGNOSES: 1.  Chronic pelvic pain. 2.  Suspected hematometra.  POSTOPERATIVE DIAGNOSES: 1.  Chronic pelvic pain. 2.  Suspected hematometra.  PROCEDURE:  Total vaginal hysterectomy with bilateral salpingectomy.  SURGEON:  Freda Munro, MD  ASSISTANT:  Rogue Bussing  ANESTHESIA:  General and local.  ANTIBIOTICS:  Ancef 2 g.  DRAINS:  Foley to bedside drainage.  FINDINGS: 1.  The patient had normal fallopian tubes bilaterally with evidence of past tubal ligation with Filshie clips. 2.  The ovaries were normal bilaterally. 3.  The uterus appeared to be boggy, and a dark fluid was noted to be coming through the fallopian tube after the specimen was removed consistent with a hematometra.  DESCRIPTION OF PROCEDURE:  The patient was taken to the operating room where a general anesthetic was administered without difficulty.  She was then placed in the dorsal lithotomy position.  She was prepped and draped in the usual fashion for this  procedure.  Her bladder was drained with a red rubber catheter.  Exam under anesthesia revealed an anteverted uterus of normal size and shape.  There were no other pelvic masses.  A weighted speculum was placed in the vagina.  20 mL of 1% lidocaine with  epinephrine was used for paracervical block.  A single-tooth tenaculum was applied to the cervix.  The posterior cul-de-sac was entered sharply.  The uterosacral ligaments were bilaterally clamped, cut, and ligated with 0 Monocryl suture.  The cervix was  then circumscribed.  The anterior cul-de-sac was entered sharply.  The bladder pillars were bilaterally clamped, cut, and ligated with 0 Monocryl suture.  The cardinal ligaments were serially clamped, cut, and ligated with 0 Monocryl  suture.  The  uterine arteries were bilaterally clamped, cut, and ligated with 0 Monocryl suture.  Once the level of the fallopian tube was reached, the uterus was inverted, and the triple pedicle, which included the fallopian tube, ovarian ligament, and round  ligaments, was bilaterally clamped, cut, and the specimen removed.  At this point, both fallopian tubes were visualized.  The right fallopian tube was grasped with the Babcock, the mesosalpinx cauterized, and once the level of the Sampson artery was  seen, a right-angle clamp was placed over the remaining mesosalpinx.  This pedicle was then ligated with 0 Monocryl suture and the specimen removed.  A similar procedure was performed on the right fallopian tube.  Following this, the pedicles were  checked and found to be adequately hemostatic.  There appeared to be a small area of bleeding on the right vaginal cuff which was made hemostatic with an interrupted 0 Monocryl suture in a figure-of-eight fashion.  Following this, the posterior cuff was  closed using 2-0 Vicryl in a running locking fashion.  The uterosacral ligaments were brought to the midline and sutured together with 0 Monocryl suture.  The remaining vaginal cuff was closed vertically using 2-0 Vicryl in a running locking fashion.   The patient was extubated and taken to recovery room.  Instrument and lap counts were correct x2.  LN/NUANCE  D:09/23/2018 T:09/23/2018 JOB:[id number]

## 2020-01-31 IMAGING — US US TRANSVAGINAL NON-OB
1 series · 13 of 25 positions shown · non-contrast
Comparison: None.

CLINICAL DATA: Recurrent right lower quadrant abdominal pain prior
demonstration.

EXAM:
TRANSABDOMINAL ULTRASOUND OF PELVIS
DOPPLER ULTRASOUND OF OVARIES
TECHNIQUE: Transabdominal ultrasound examination of the pelvis was performed
including evaluation of the uterus, ovaries, adnexal regions, and
pelvic cul-de-sac.
Color and duplex Doppler ultrasound was utilized to evaluate blood
flow to the ovaries.

[Series 1: us transvaginal non-ob · 0.18mm/px · 13 of 105 slices shown]
[im 1/105]
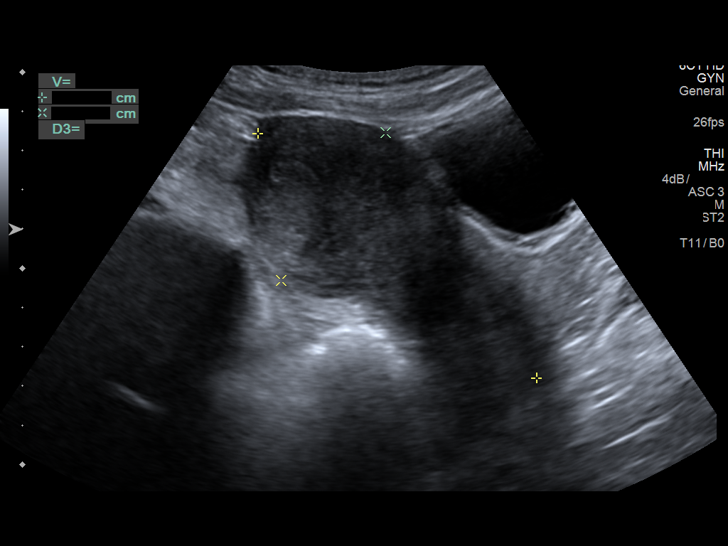
[im 9/105]
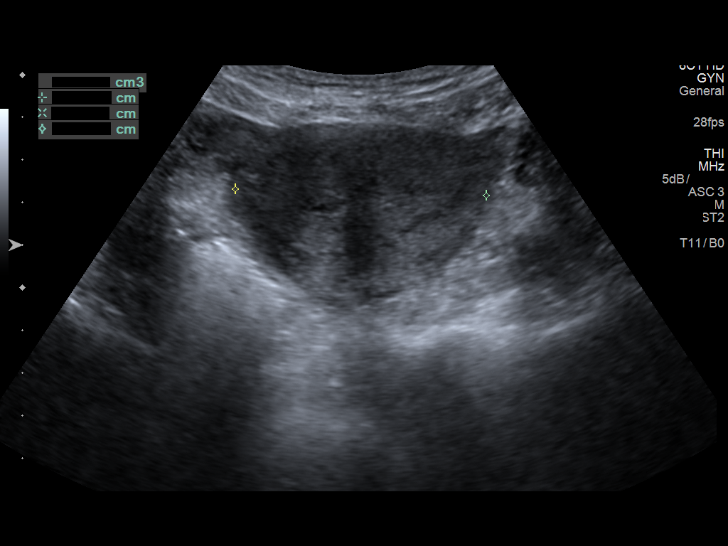
[im 18/105]
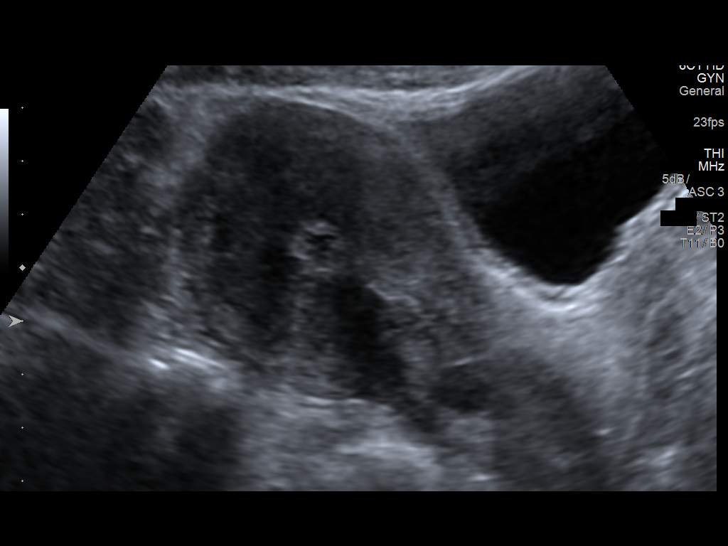
[im 27/105]
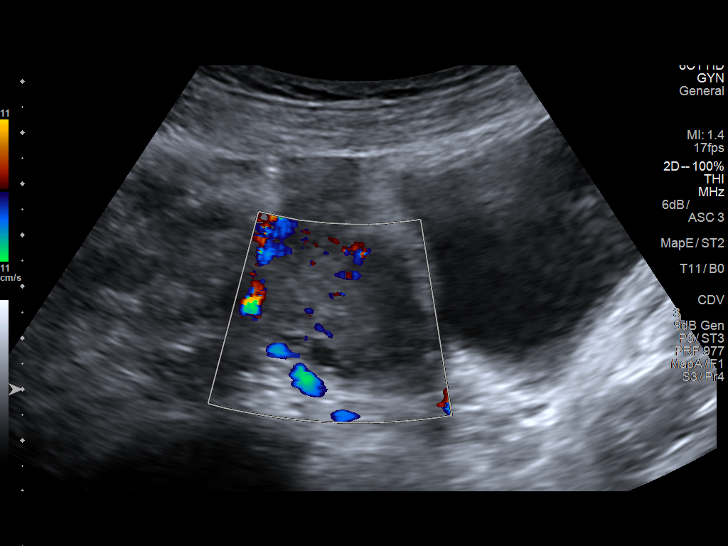
[im 35/105]
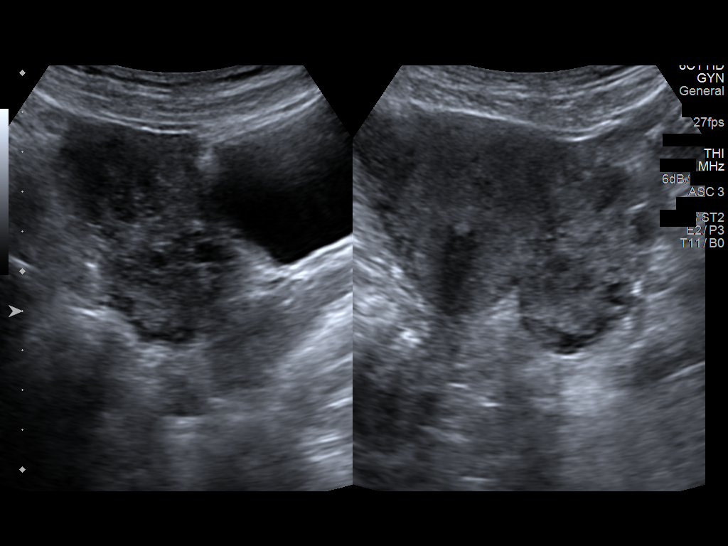
[im 44/105]
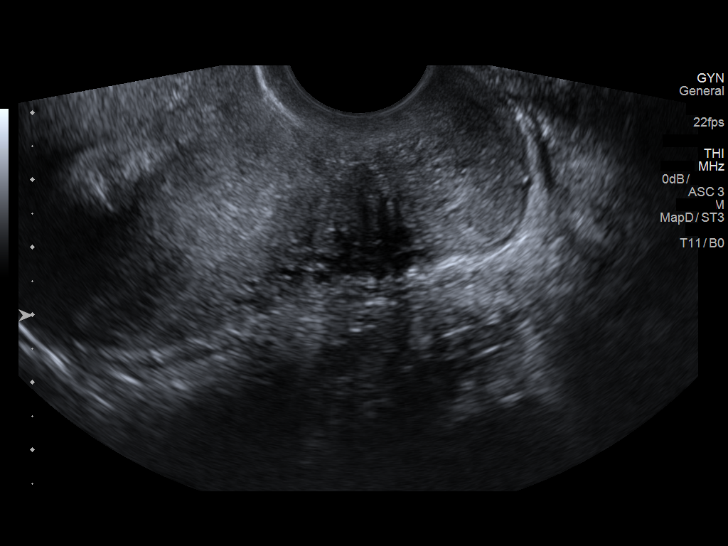
[im 53/105]
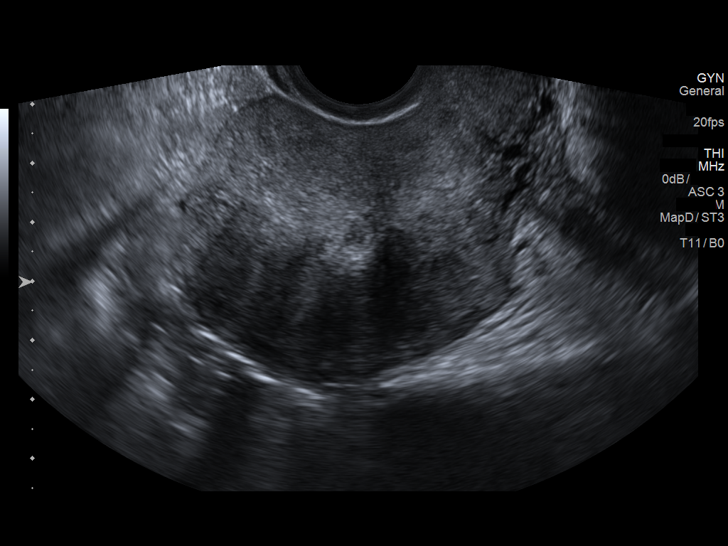
[im 61/105]
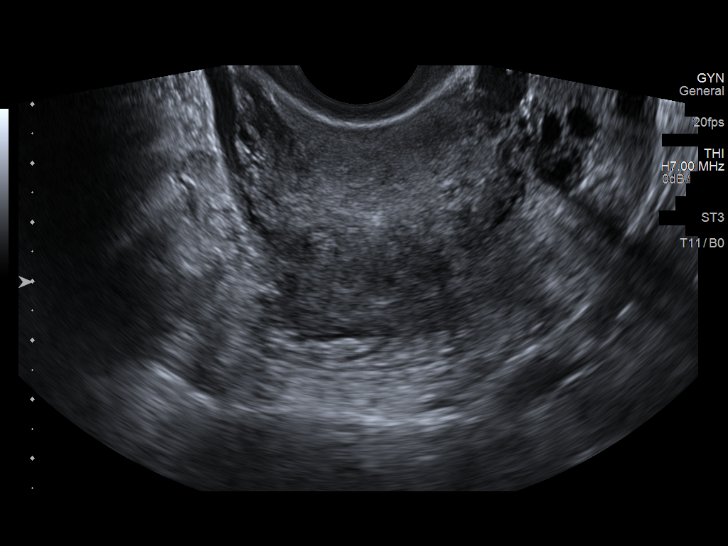
[im 70/105]
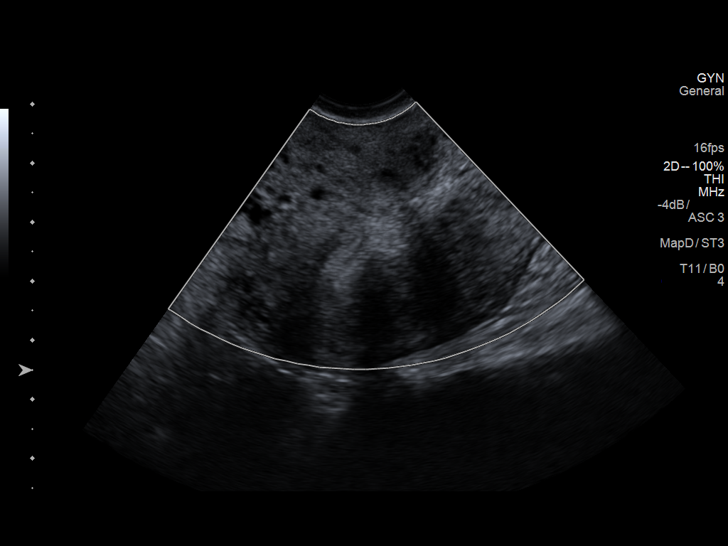
[im 79/105]
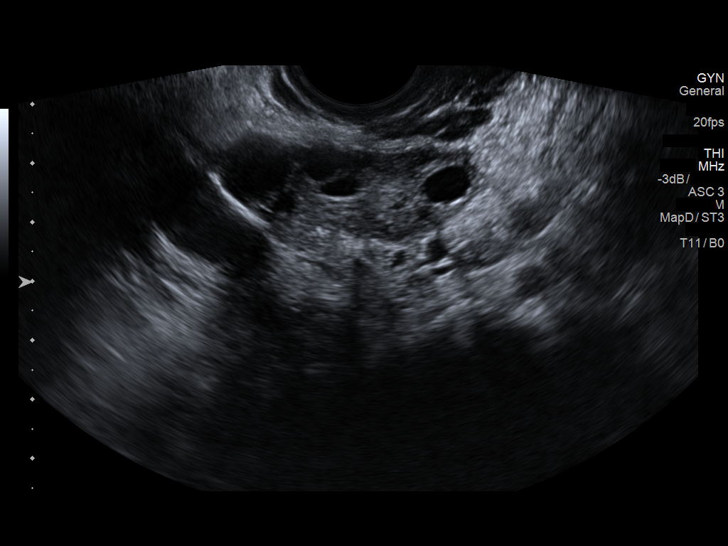
[im 87/105]
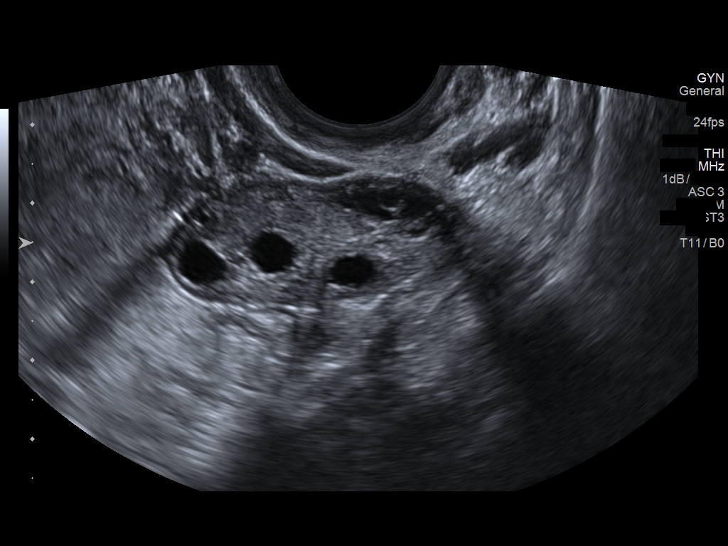
[im 96/105]
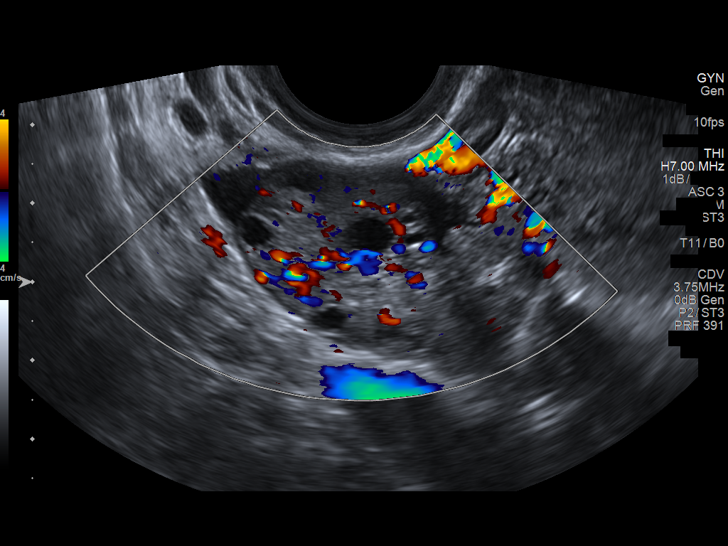
[im 105/105]
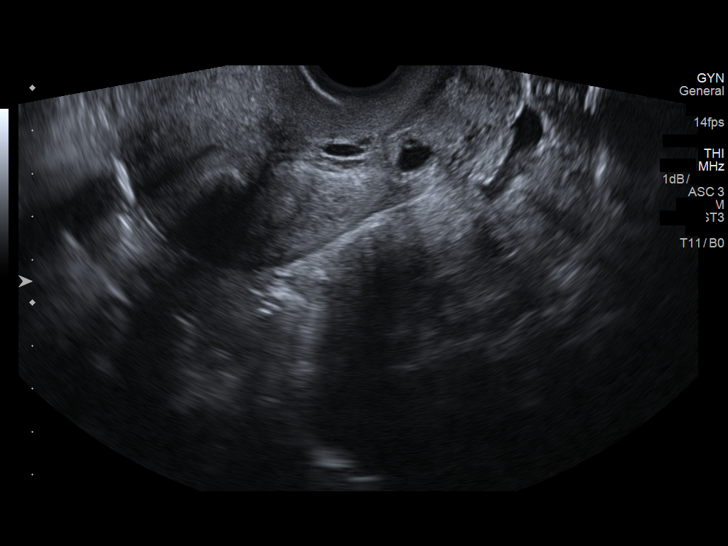

[13 of 25 positions shown; findings below may reference images not displayed]

FINDINGS: Uterus

Measurements: 9.2 x 6.0 x 5.4 cm = volume: 156 mL. No fibroids or
other mass visualized.

Endometrium

Thickness: 12 mm which is within normal limits. Possible 1 cm
calcification seen within endometrial cavity which may represent
sequela of prior endometrial ablation.

Right ovary

Measurements: 3.3 x 3.5 x 2.5 cm = volume: 15 mL. Normal
appearance/no adnexal mass.

Left ovary

Measurements: 4.7 x 3.5 x 1.8 cm = volume: 16 mL. Normal
appearance/no adnexal mass.

Pulsed Doppler evaluation demonstrates normal low-resistance
arterial and venous waveforms in both ovaries.

Other: Trace free fluid is noted which most likely is physiologic.
IMPRESSION: Possible 1 cm calcification seen within endometrial cavity which may
represent sequela of prior endometrial ablation. No other
significant pelvic abnormality seen. No evidence of ovarian torsion
or mass.
